# Patient Record
Sex: Female | Born: 1980 | Race: White | Hispanic: No | Marital: Married | Smoking: Never smoker
Health system: Southern US, Community
[De-identification: ages and names within clinical notes are randomized; demographics above are authoritative.]

## PROBLEM LIST (undated history)

## (undated) DIAGNOSIS — O24419 Gestational diabetes mellitus in pregnancy, unspecified control: Secondary | ICD-10-CM

## (undated) HISTORY — PX: DILATION AND CURETTAGE OF UTERUS: SHX78

## (undated) HISTORY — PX: WISDOM TOOTH EXTRACTION: SHX21

## (undated) HISTORY — DX: Gestational diabetes mellitus in pregnancy, unspecified control: O24.419

---

## 2005-10-31 ENCOUNTER — Other Ambulatory Visit: Admission: RE | Admit: 2005-10-31 | Discharge: 2005-10-31 | Payer: Self-pay | Admitting: Obstetrics and Gynecology

## 2005-11-08 ENCOUNTER — Ambulatory Visit (HOSPITAL_COMMUNITY): Admission: RE | Admit: 2005-11-08 | Discharge: 2005-11-08 | Payer: Self-pay | Admitting: Obstetrics and Gynecology

## 2005-11-29 ENCOUNTER — Ambulatory Visit (HOSPITAL_COMMUNITY): Admission: AD | Admit: 2005-11-29 | Discharge: 2005-11-30 | Payer: Self-pay | Admitting: Obstetrics and Gynecology

## 2005-11-29 ENCOUNTER — Encounter (INDEPENDENT_AMBULATORY_CARE_PROVIDER_SITE_OTHER): Payer: Self-pay | Admitting: Specialist

## 2006-08-21 ENCOUNTER — Other Ambulatory Visit: Admission: RE | Admit: 2006-08-21 | Discharge: 2006-08-21 | Payer: Self-pay | Admitting: Obstetrics and Gynecology

## 2006-10-28 ENCOUNTER — Inpatient Hospital Stay (HOSPITAL_COMMUNITY): Admission: AD | Admit: 2006-10-28 | Discharge: 2006-10-29 | Payer: Self-pay | Admitting: Obstetrics and Gynecology

## 2006-11-05 ENCOUNTER — Ambulatory Visit (HOSPITAL_COMMUNITY): Admission: RE | Admit: 2006-11-05 | Discharge: 2006-11-05 | Payer: Self-pay | Admitting: Obstetrics and Gynecology

## 2007-04-01 ENCOUNTER — Inpatient Hospital Stay (HOSPITAL_COMMUNITY): Admission: RE | Admit: 2007-04-01 | Discharge: 2007-04-03 | Payer: Self-pay | Admitting: Obstetrics and Gynecology

## 2007-04-25 ENCOUNTER — Other Ambulatory Visit: Admission: RE | Admit: 2007-04-25 | Discharge: 2007-04-25 | Payer: Self-pay | Admitting: Obstetrics and Gynecology

## 2007-12-12 IMAGING — US US OB TRANSVAGINAL
1 series · 14 of 25 positions shown · non-contrast
Comparison: 11/08/05.

CLINICAL DATA: 25-year-old female approximately 9 weeks pregnant with vaginal bleeding.  
TRANSVAGINAL OBSTETRICAL US:
TECHNIQUE: Transvaginal ultrasound was performed for evaluation of the gestation as well as the maternal uterus and adnexal regions.

[Series 1: us ob transvaginal · 0.19mm/px · 14 of 25 slices shown]
[im 1/25]
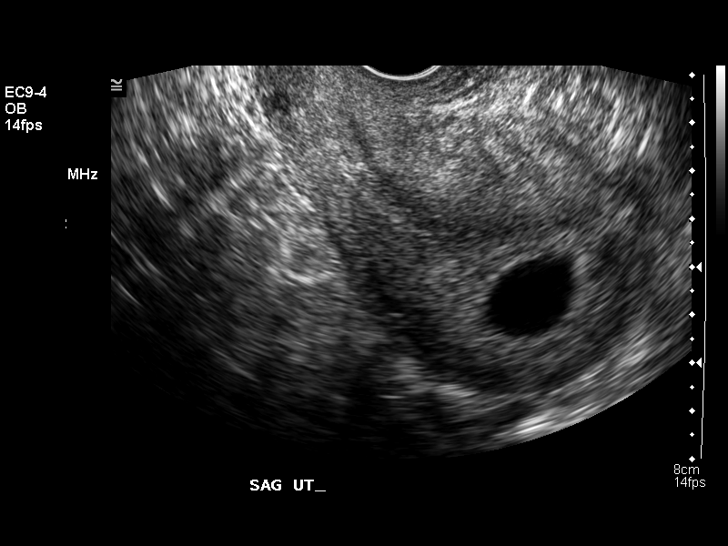
[im 3/25]
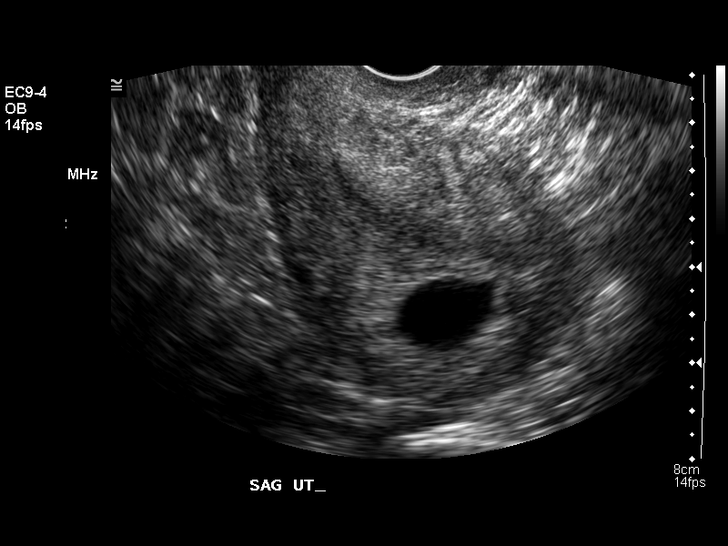
[im 5/25]
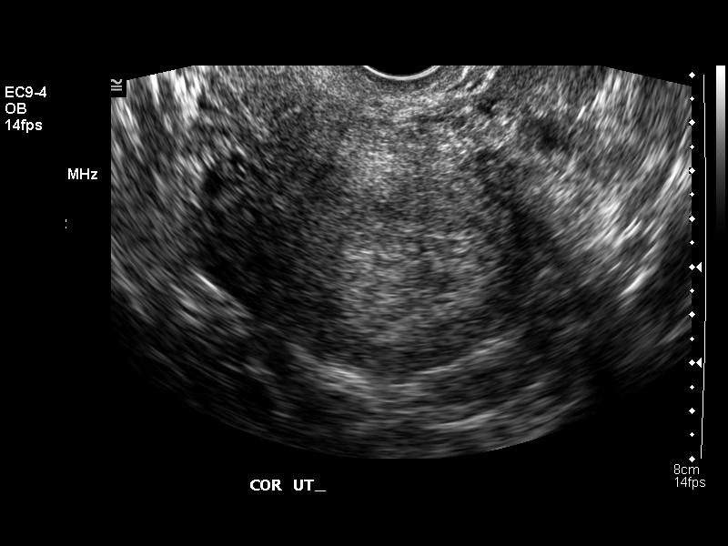
[im 7/25]
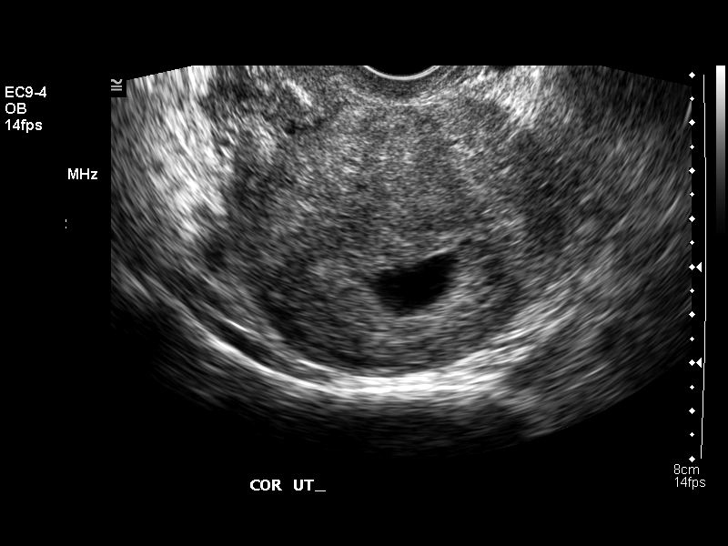
[im 9/25]
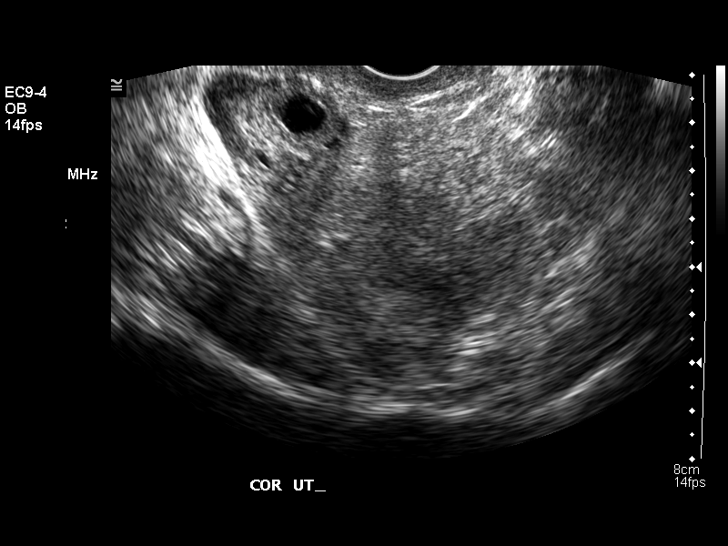
[im 10/25]
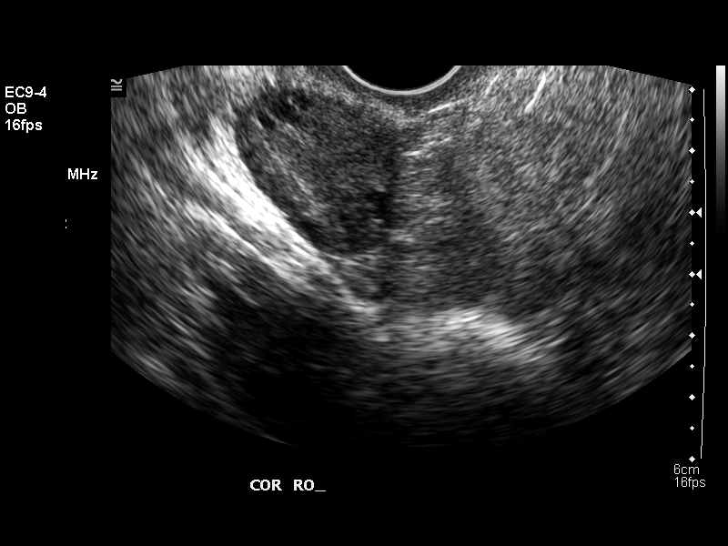
[im 12/25]
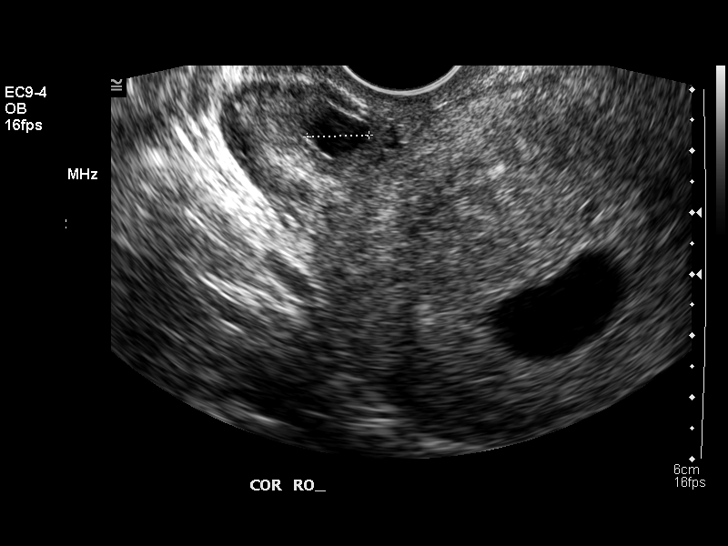
[im 14/25]
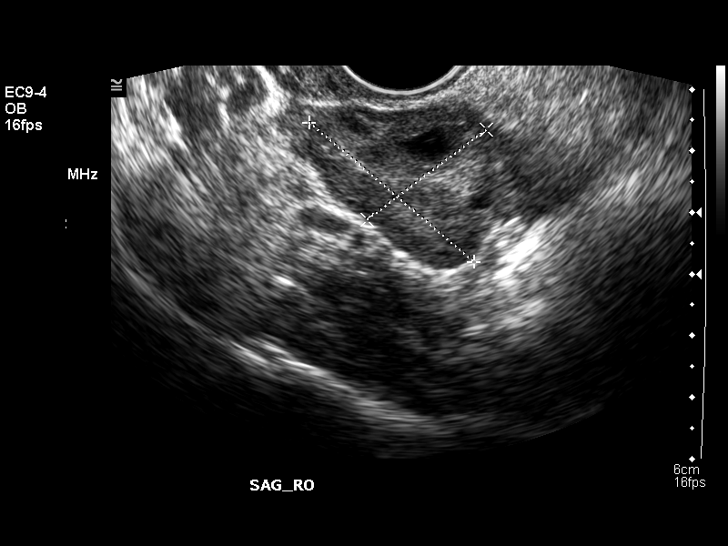
[im 16/25]
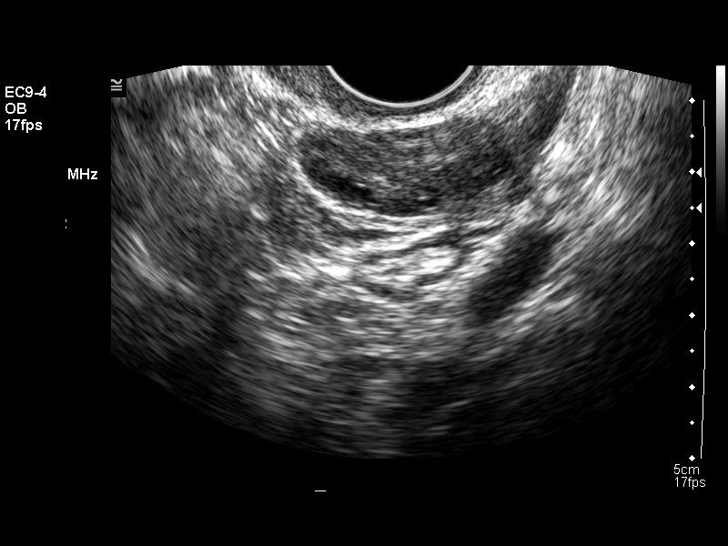
[im 17/25]
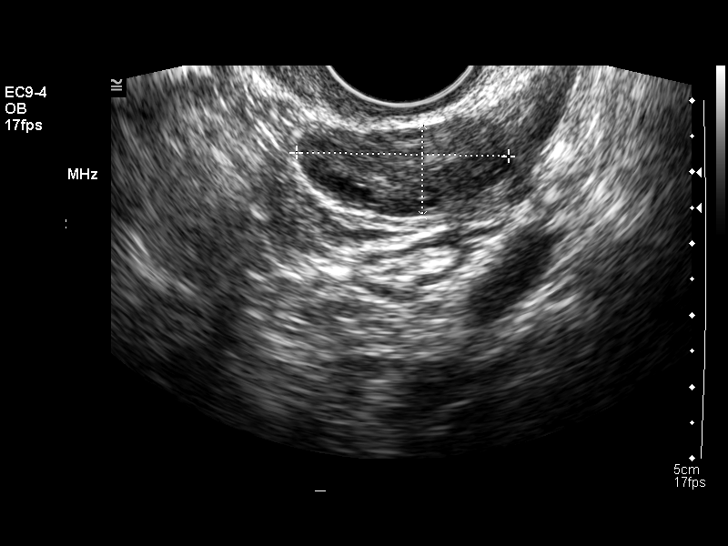
[im 19/25]
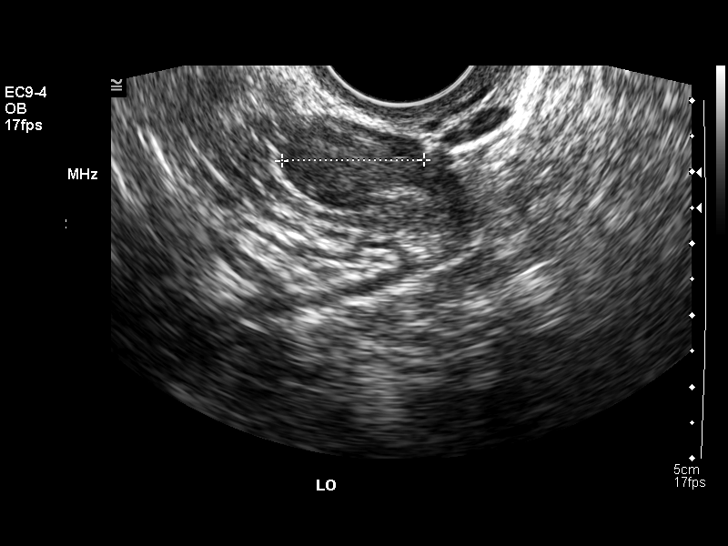
[im 21/25]
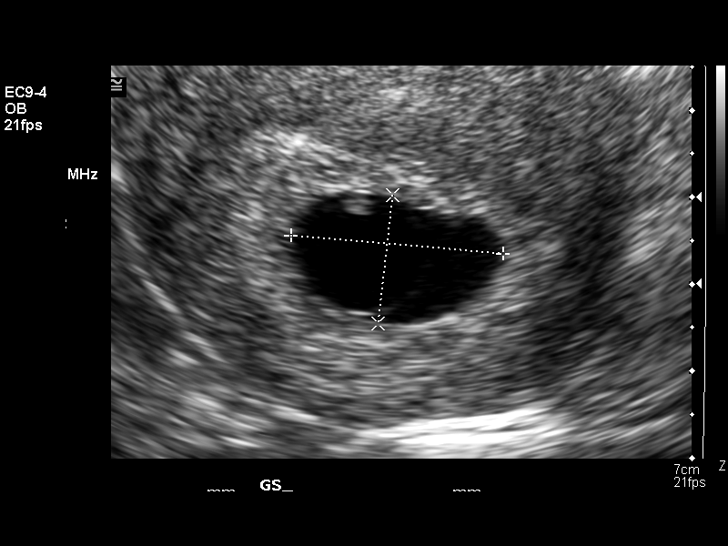
[im 23/25]
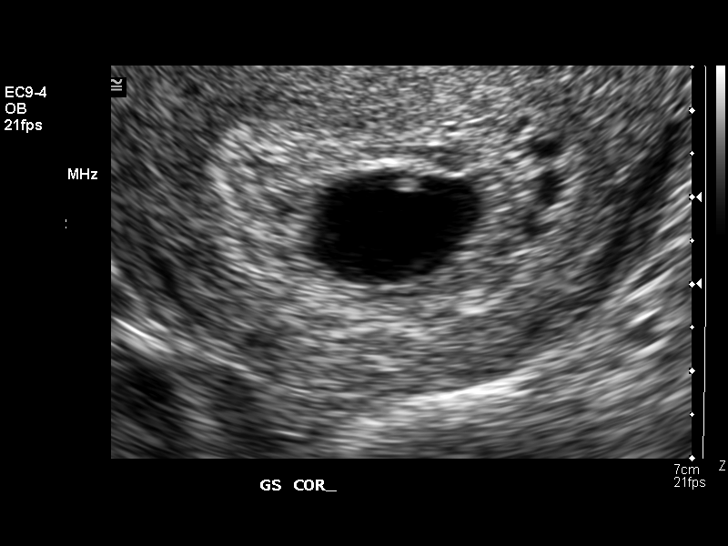
[im 25/25]
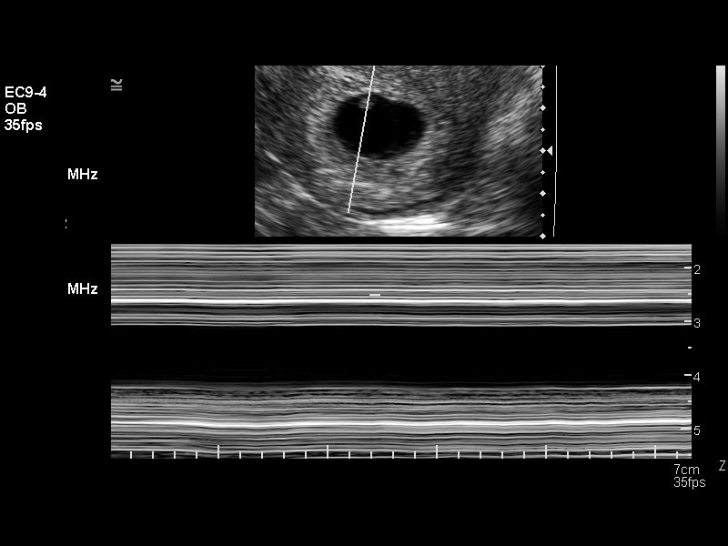

[14 of 25 positions shown; findings below may reference images not displayed]

FINDINGS: Within the uterus, there is a single intrauterine gestational sac with a mean sac diameter of 20 mm which correlates with a 6 week 6 day gestational age.  No yolk sac is visualized. A tiny fetal pole is demonstrated with a crown rump length of only 3.3 mm.  No detectable heart rate or visualized cardiac activity.  Previously the fetal pole had a low heart rate at 88 bpm on the 11/08/05 exam.  The appearance today is consistent with fetal demise.
No subchorionic hemorrhage.  The right ovary measures 3.5 x 2.4 x 2.5 cm and contains a 14 mm corpus luteum cyst.  The left ovary is normal measuring 3.0 x 1.3 x 2.0 cm.  No free fluid.
IMPRESSION: No detectable fetal cardiac activity with a small fetal pole and lack of progression when compared to 11/08/05.  The ultrasound findings are consistent with fetal demise.

## 2008-06-25 ENCOUNTER — Ambulatory Visit (HOSPITAL_COMMUNITY): Admission: RE | Admit: 2008-06-25 | Discharge: 2008-06-25 | Payer: Self-pay | Admitting: Obstetrics

## 2008-11-02 ENCOUNTER — Inpatient Hospital Stay (HOSPITAL_COMMUNITY): Admission: AD | Admit: 2008-11-02 | Discharge: 2008-11-02 | Payer: Self-pay | Admitting: Obstetrics

## 2008-11-17 IMAGING — US US OB FOLLOW-UP
1 series · 2 of 2 positions shown · non-contrast
Comparison: none

OBSTETRICAL ULTRASOUND:

 This ultrasound exam was performed in the [HOSPITAL] Ultrasound Department.  The OB US report was generated in the AS system, and faxed to the ordering physician.  This report is also available in [REDACTED] PACS.

[Series 1: us ob follow-up · 0.22mm/px · 2 of 2 slices shown]
[im 1/2]
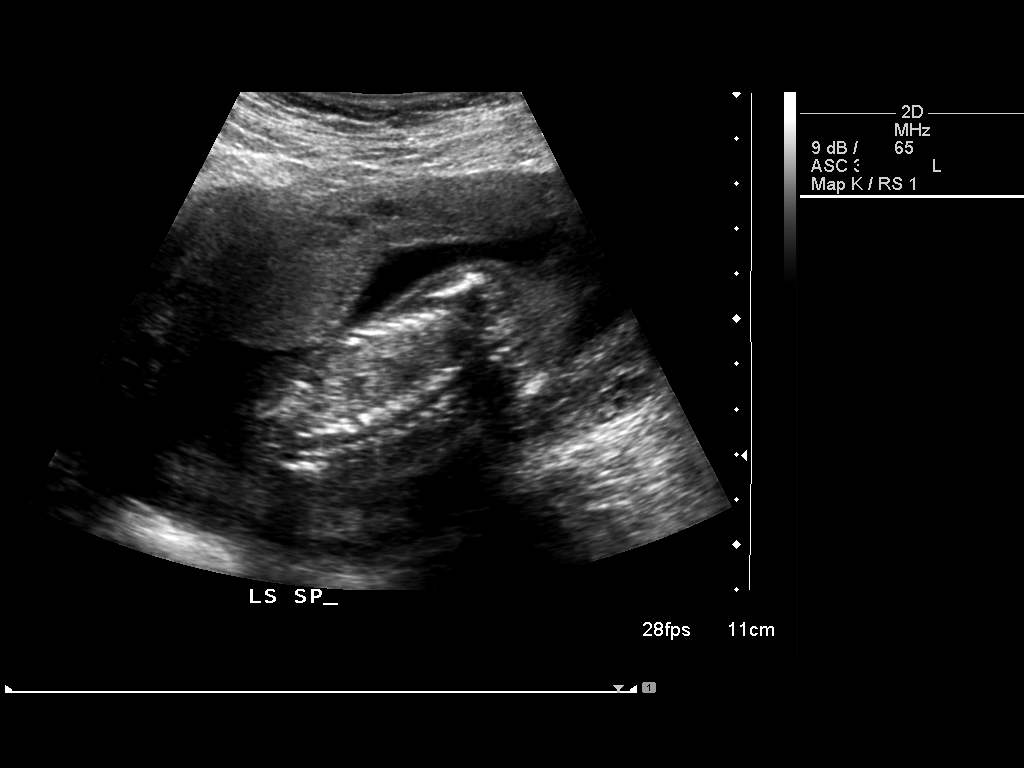
[im 2/2]
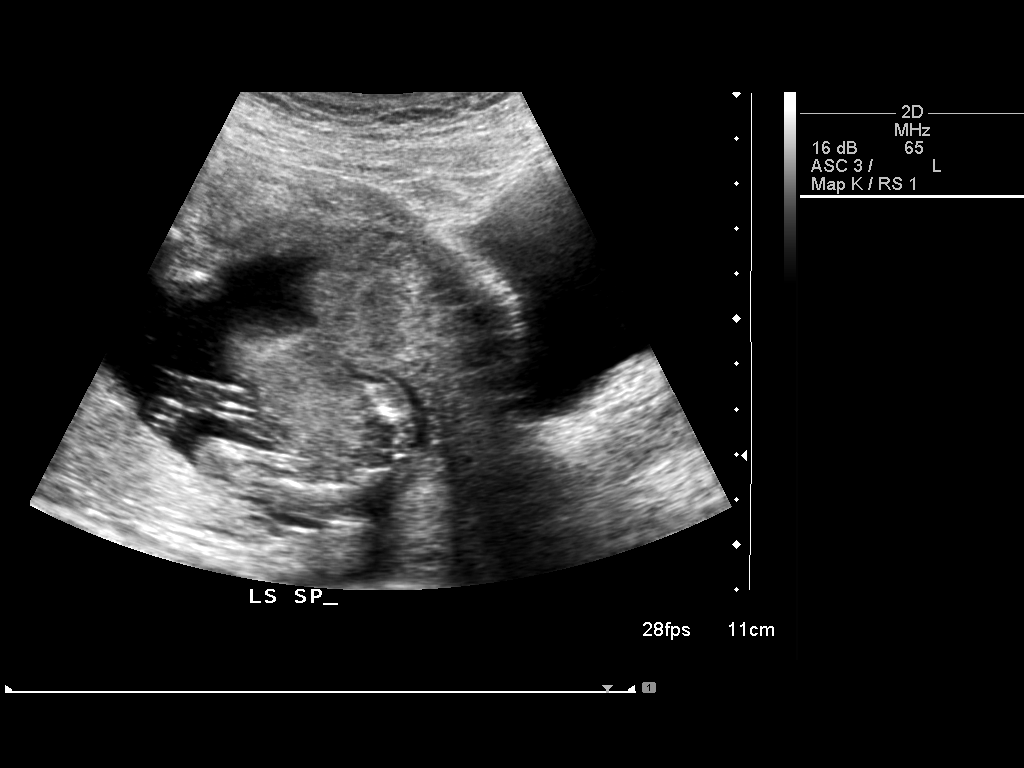

[2 of 2 positions shown; findings below may reference images not displayed]

IMPRESSION: See AS Obstetric US report.

## 2009-01-25 ENCOUNTER — Ambulatory Visit (HOSPITAL_COMMUNITY): Admission: RE | Admit: 2009-01-25 | Discharge: 2009-01-25 | Payer: Self-pay | Admitting: Obstetrics

## 2009-01-28 ENCOUNTER — Ambulatory Visit (HOSPITAL_COMMUNITY): Admission: RE | Admit: 2009-01-28 | Discharge: 2009-01-28 | Payer: Self-pay | Admitting: Obstetrics

## 2009-01-31 ENCOUNTER — Ambulatory Visit (HOSPITAL_COMMUNITY): Admission: RE | Admit: 2009-01-31 | Discharge: 2009-01-31 | Payer: Self-pay | Admitting: Obstetrics

## 2009-02-03 ENCOUNTER — Ambulatory Visit (HOSPITAL_COMMUNITY): Admission: RE | Admit: 2009-02-03 | Discharge: 2009-02-03 | Payer: Self-pay | Admitting: Obstetrics

## 2009-02-07 ENCOUNTER — Inpatient Hospital Stay (HOSPITAL_COMMUNITY): Admission: AD | Admit: 2009-02-07 | Discharge: 2009-02-09 | Payer: Self-pay | Admitting: Obstetrics

## 2010-03-22 ENCOUNTER — Ambulatory Visit (HOSPITAL_COMMUNITY): Admission: RE | Admit: 2010-03-22 | Discharge: 2010-03-22 | Payer: Self-pay | Admitting: Obstetrics

## 2010-06-05 ENCOUNTER — Encounter: Payer: Self-pay | Admitting: Obstetrics

## 2010-07-10 ENCOUNTER — Inpatient Hospital Stay (HOSPITAL_COMMUNITY)
Admission: AD | Admit: 2010-07-10 | Discharge: 2010-07-11 | Disposition: A | Discharge status: Home / Self Care | Payer: BC Managed Care – PPO | Source: Ambulatory Visit | Attending: Obstetrics | Admitting: Obstetrics

## 2010-07-10 DIAGNOSIS — O4700 False labor before 37 completed weeks of gestation, unspecified trimester: Secondary | ICD-10-CM | POA: Insufficient documentation

## 2010-07-11 ENCOUNTER — Inpatient Hospital Stay (HOSPITAL_COMMUNITY)
Admission: AD | Admit: 2010-07-11 | Discharge: 2010-07-11 | Disposition: A | Discharge status: Home / Self Care | Payer: BC Managed Care – PPO | Source: Ambulatory Visit | Attending: Obstetrics | Admitting: Obstetrics

## 2010-07-11 DIAGNOSIS — O4700 False labor before 37 completed weeks of gestation, unspecified trimester: Secondary | ICD-10-CM

## 2010-07-11 LAB — URINALYSIS, ROUTINE W REFLEX MICROSCOPIC
Hgb urine dipstick: NEGATIVE
Ketones, ur: 15 mg/dL — AB
Urine Glucose, Fasting: NEGATIVE mg/dL
pH: 7.5 (ref 5.0–8.0)

## 2010-07-28 ENCOUNTER — Observation Stay (HOSPITAL_COMMUNITY)
Admission: AD | Admit: 2010-07-28 | Discharge: 2010-07-29 | DRG: 382 | Disposition: A | Discharge status: Home / Self Care | Payer: BC Managed Care – PPO | Source: Ambulatory Visit | Attending: Obstetrics & Gynecology | Admitting: Obstetrics & Gynecology

## 2010-07-28 DIAGNOSIS — Z22330 Carrier of Group B streptococcus: Secondary | ICD-10-CM

## 2010-07-28 DIAGNOSIS — O479 False labor, unspecified: Principal | ICD-10-CM | POA: Diagnosis present

## 2010-07-28 DIAGNOSIS — O99891 Other specified diseases and conditions complicating pregnancy: Secondary | ICD-10-CM | POA: Diagnosis present

## 2010-07-29 LAB — CBC
HCT: 33.1 % — ABNORMAL LOW (ref 36.0–46.0)
Hemoglobin: 10.7 g/dL — ABNORMAL LOW (ref 12.0–15.0)
WBC: 10 10*3/uL (ref 4.0–10.5)

## 2010-08-06 ENCOUNTER — Inpatient Hospital Stay (HOSPITAL_COMMUNITY)
Admission: AD | Admit: 2010-08-06 | Discharge: 2010-08-09 | DRG: 373 | Disposition: A | Discharge status: Home / Self Care | Payer: BC Managed Care – PPO | Source: Ambulatory Visit | Attending: Obstetrics | Admitting: Obstetrics

## 2010-08-06 DIAGNOSIS — O99892 Other specified diseases and conditions complicating childbirth: Principal | ICD-10-CM | POA: Diagnosis present

## 2010-08-06 DIAGNOSIS — Z22330 Carrier of Group B streptococcus: Secondary | ICD-10-CM

## 2010-08-06 LAB — CBC
HCT: 31.6 % — ABNORMAL LOW (ref 36.0–46.0)
MCV: 83.8 fL (ref 78.0–100.0)
RBC: 3.77 MIL/uL — ABNORMAL LOW (ref 3.87–5.11)
WBC: 5.6 10*3/uL (ref 4.0–10.5)

## 2010-08-08 LAB — CBC
HCT: 27.8 % — ABNORMAL LOW (ref 36.0–46.0)
Hemoglobin: 8.6 g/dL — ABNORMAL LOW (ref 12.0–15.0)
MCH: 26.4 pg (ref 26.0–34.0)
MCV: 85.3 fL (ref 78.0–100.0)
RBC: 3.26 MIL/uL — ABNORMAL LOW (ref 3.87–5.11)

## 2010-08-18 LAB — CBC
HCT: 27.3 % — ABNORMAL LOW (ref 36.0–46.0)
HCT: 32.6 % — ABNORMAL LOW (ref 36.0–46.0)
Hemoglobin: 10.9 g/dL — ABNORMAL LOW (ref 12.0–15.0)
Hemoglobin: 9.3 g/dL — ABNORMAL LOW (ref 12.0–15.0)
MCHC: 33.5 g/dL (ref 30.0–36.0)
MCHC: 34 g/dL (ref 30.0–36.0)
MCV: 83.2 fL (ref 78.0–100.0)
MCV: 83.4 fL (ref 78.0–100.0)
Platelets: 208 K/uL (ref 150–400)
Platelets: 267 K/uL (ref 150–400)
RBC: 3.27 MIL/uL — ABNORMAL LOW (ref 3.87–5.11)
RBC: 3.92 MIL/uL (ref 3.87–5.11)
RDW: 16.3 % — ABNORMAL HIGH (ref 11.5–15.5)
RDW: 16.7 % — ABNORMAL HIGH (ref 11.5–15.5)
WBC: 9.5 K/uL (ref 4.0–10.5)
WBC: 9.8 K/uL (ref 4.0–10.5)

## 2010-08-18 LAB — SYPHILIS: RPR W/REFLEX TO RPR TITER AND TREPONEMAL ANTIBODIES, TRADITIONAL SCREENING AND DIAGNOSIS ALGORITHM: RPR Ser Ql: NONREACTIVE

## 2010-08-21 LAB — URINALYSIS, ROUTINE W REFLEX MICROSCOPIC
Hgb urine dipstick: NEGATIVE
Protein, ur: NEGATIVE mg/dL
Urobilinogen, UA: 0.2 mg/dL (ref 0.0–1.0)

## 2010-09-26 NOTE — Discharge Summary (Signed)
NAME:  Whitney Barajas, Whitney Barajas           ACCOUNT NO.:  [id number]   MEDICAL RECORD NO.:  [id number]          PATIENT TYPE:  INP   LOCATION:  9121                          FACILITY:  [facility]   PHYSICIAN:  James A. Ashley Royalty, M.D.DATE OF BIRTH:  12-21-1980   DATE OF ADMISSION:  04/01/2007  DATE OF DISCHARGE:  04/03/2007                               DISCHARGE SUMMARY   DISCHARGE DIAGNOSES:  1. Intrauterine pregnancy at [redacted] weeks gestation, delivered.  2. Term birth living child, vertex.  3. Group B strep carrier.   OPERATIONS AND PROCEDURES:  OB delivery.   COMPLICATIONS:  None.   DISCHARGE MEDICATIONS:  1. Darvocet.  2. Motrin 600 mg.   HISTORY AND PHYSICAL:  This is a 25-year gravida 4, para 1-0-2-1 at 39+  weeks' gestation.  Prenatal care was complicated by smoking and group B  strep carrier.  The patient was admitted for induction secondary to  advanced cervical changes and musculoskeletal discomfort.  For the  remainder of the history and physical, please see chart.   HOSPITAL COURSE:  The patient was admitted to [facility]  [place].  Initial laboratory studies were drawn.  She was given  antibiotic prophylaxis.  Initial cervical examination was 3 cm, 80%, -2  to -3 station, vertex presentation.  Artificial rupture of membranes was  accomplished and intrauterine pressure catheter placed.  The patient was  given Pitocin.  She went on to deliver on April 01, 2007 a 7 pounds  11 ounces female, Apgars 9 at 1 minute and 9 at 5 minutes, sent to newborn  nursery.  Delivery was accomplished by Dr. Sylvester Harder over an intact  perineum.  There were no lacerations.  The patient's postpartum course  was benign.  She was discharged on the second postpartum day afebrile  and in satisfactory condition.   DISPOSITION:  The patient is to return to [facility] and  Obstetrics on April 25, 2007 for a postpartum evaluation.      James A. Ashley Royalty, M.D.  Electronically  Signed     JAM/MEDQ  D:  04/23/2007  T:  04/24/2007  Job:  [id number]

## 2010-09-29 NOTE — Op Note (Signed)
NAME:  Whitney Barajas, Whitney Barajas           ACCOUNT NO.:  [id number]   MEDICAL RECORD NO.:  [id number]          PATIENT TYPE:  AMB   LOCATION:  SDC                           FACILITY:  [facility]   PHYSICIAN:  James A. Ashley Royalty, M.D.DATE OF BIRTH:  05/17/1980   DATE OF PROCEDURE:  11/29/2005  DATE OF DISCHARGE:  11/30/2005                                 OPERATIVE REPORT   PREOPERATIVE DIAGNOSES:  1.  Intrauterine pregnancy at approximately [redacted] weeks gestation.  2.  Inevitable abortion.   POSTOPERATIVE DIAGNOSES:  1.  Intrauterine pregnancy at approximately [redacted] weeks gestation.  2.  Inevitable abortion.   PROCEDURE:  Suction dilatation curettage.   SURGEON:  Rudy Jew. Ashley Royalty, M.D.   ANESTHESIA:  Monitored anesthesia care with 1% Xylocaine paracervical block  (20 mL).   ESTIMATED BLOOD LOSS:  Less than 25 mL.   COMPLICATIONS:  None.   PACKS AND DRAINS:  None.   SPECIMENS:  Products of conception.   PROCEDURE:  The patient taken to the operating room, placed in the dorsal  supine position.  After IV sedation was administered, she was placed in the  lithotomy position and prepped and draped in usual manner for vaginal  surgery.  Posterior weighted retractor was placed per vagina.  The anterior  lip of cervix grasped with a single-tooth tenaculum.  Approximately 20 mL of  1% Xylocaine were instilled circumferentially in the cervix to create a  paracervical block.  The uterus was then gently sounded to approximately 10  cm.  The cervix was noted to be already substantially dilated.  It was  verified as dilated to size 29 Pratt without any effort.  It was further  dilated to size 31 Pratt.  10 mm suction curette was then introduced in the  uterine cavity and suction applied.  A moderate amount of apparent products  of conception were delivered through the tubing.  After several passes with  the suction  curette, no additional tissue was obtained.  At this point the patient was  felt to have  benefited maximally from the surgical procedure.  The vaginal  instruments removed.  Hemostasis noted and the procedure terminated.   The patient was taken to the recovery room in excellent condition.  Blood  type is O positive.      James A. Ashley Royalty, M.D.  Electronically Signed     JAM/MEDQ  D:  11/29/2005  T:  11/30/2005  Job:  [id number]

## 2011-02-20 LAB — CBC
HCT: 33.5 — ABNORMAL LOW
MCHC: 34.9
MCV: 90.9
MCV: 91.8
Platelets: 232
Platelets: 294
RDW: 13.9
WBC: 11.8 — ABNORMAL HIGH

## 2011-02-20 LAB — RPR: RPR Ser Ql: NONREACTIVE

## 2011-02-20 LAB — TYPE AND SCREEN
ABO/RH(D): O POS
Antibody Screen: NEGATIVE

## 2011-02-28 LAB — CBC
HCT: 37.5
Platelets: 289
RDW: 13.3
WBC: 9.6

## 2011-02-28 LAB — KLEIHAUER-BETKE STAIN: Fetal Cells %: 0

## 2011-02-28 LAB — ABO/RH: ABO/RH(D): O POS

## 2012-04-03 IMAGING — US US OB DETAIL+14 WK
1 series · 14 of 28 positions shown · non-contrast
Comparison: none

OBSTETRICAL ULTRASOUND:
 This ultrasound exam was performed in the [HOSPITAL] Ultrasound Department.  The OB US report was generated in the AS system, and faxed to the ordering physician.  This report is also available in [HOSPITAL]?s [employer] and in [REDACTED] PACS.

[Series 1: us ob detail +14 wk · 0.22mm/px · 14 of 71 slices shown]
[im 3/71]
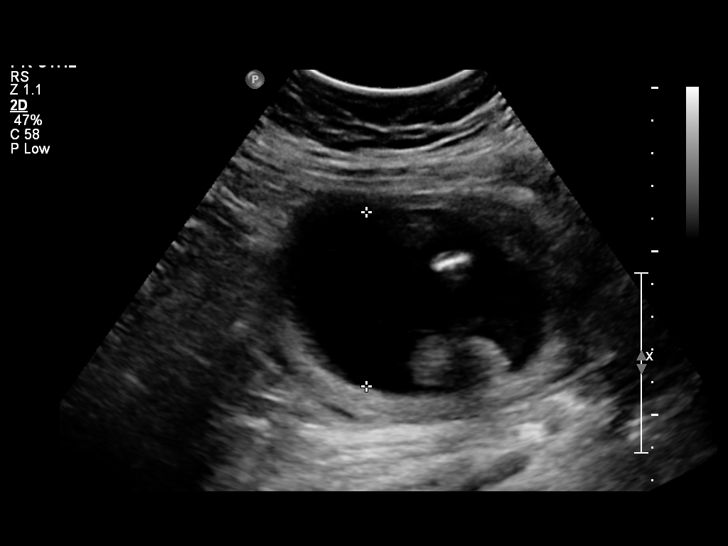
[im 8/71]
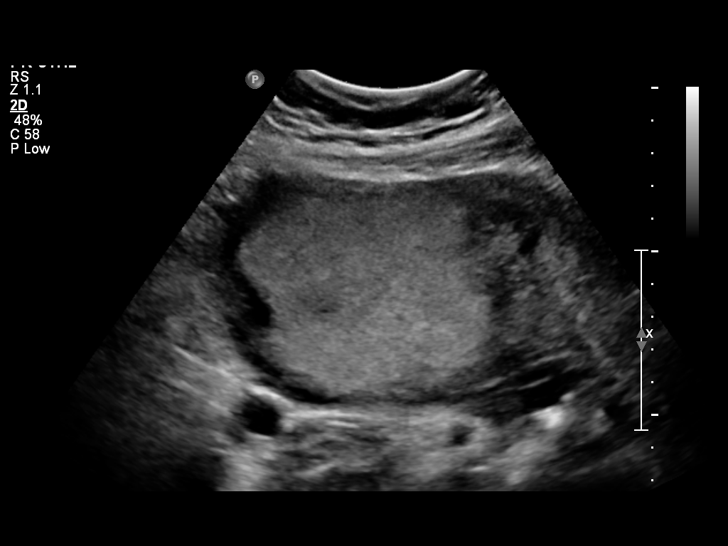
[im 13/71]
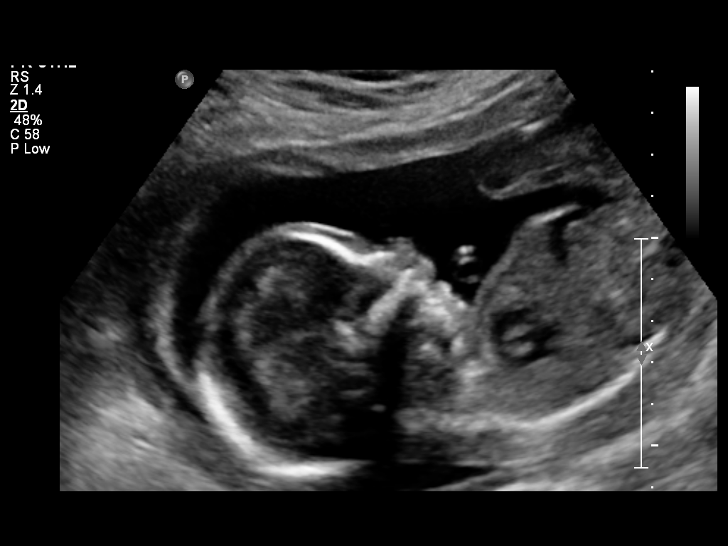
[im 19/71]
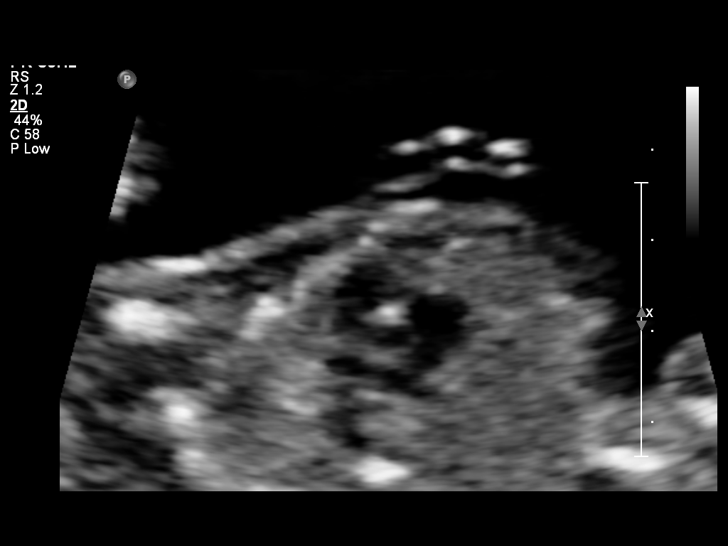
[im 24/71]
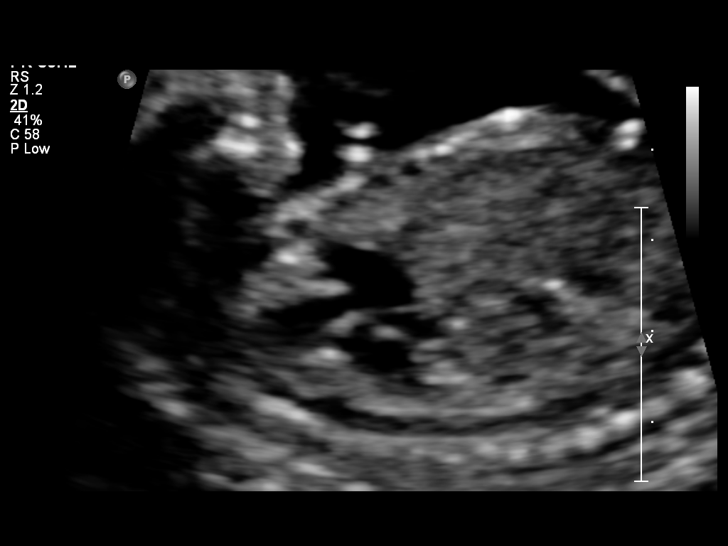
[im 29/71]
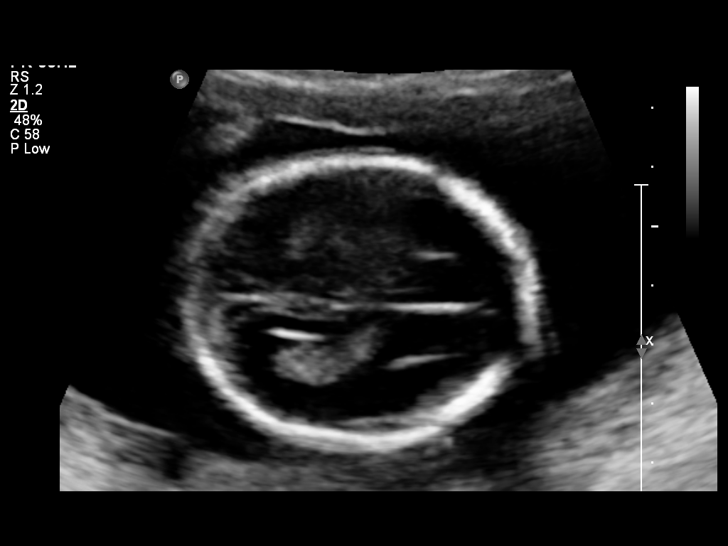
[im 34/71]
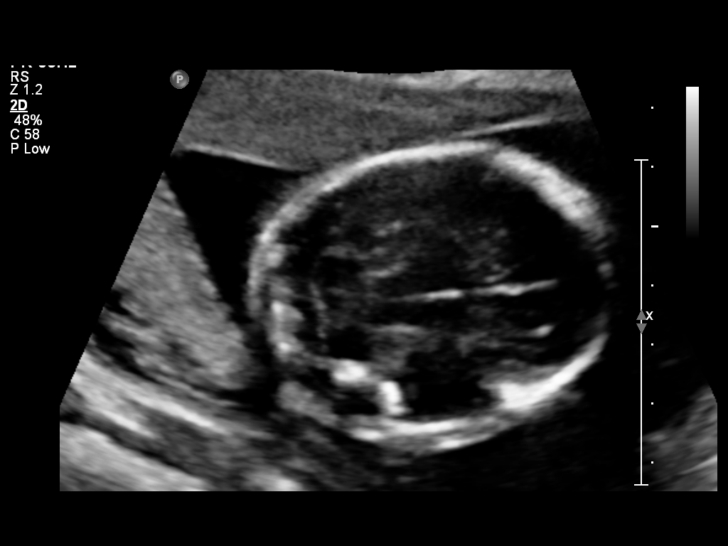
[im 39/71]
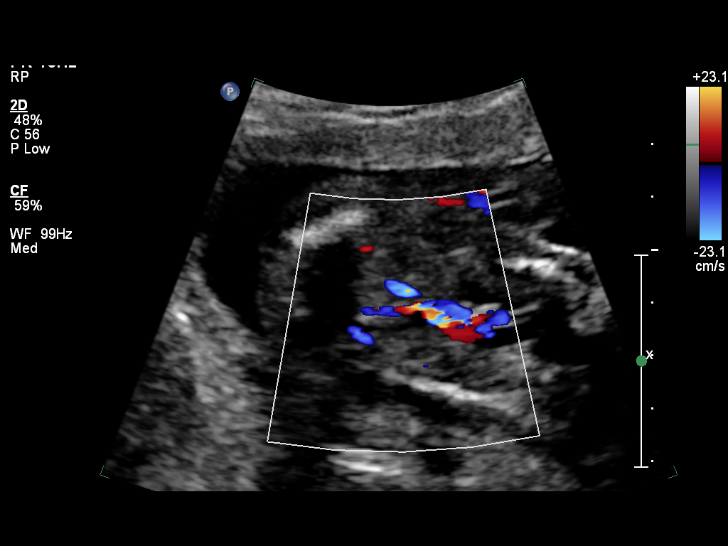
[im 45/71]
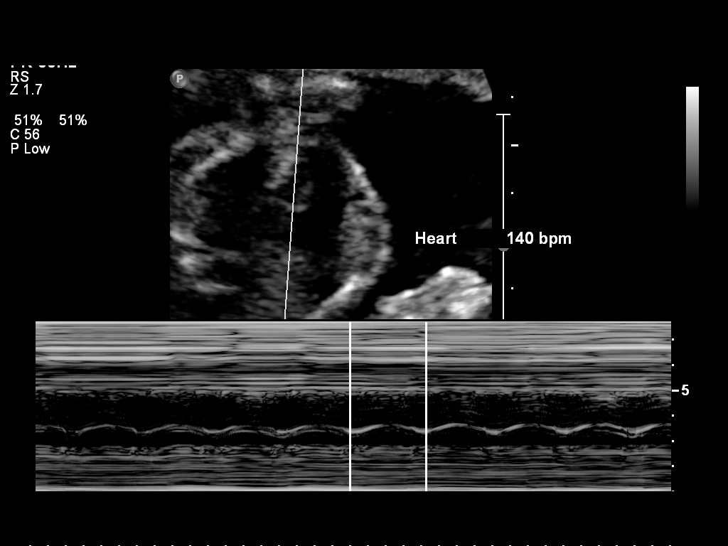
[im 50/71]
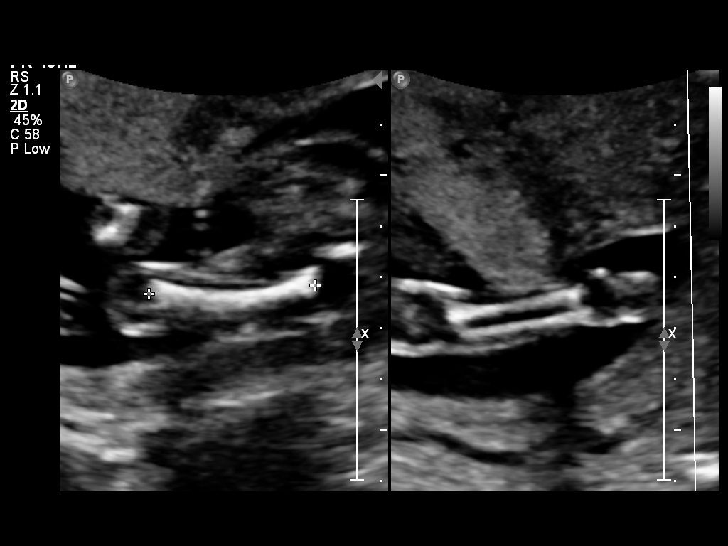
[im 55/71]
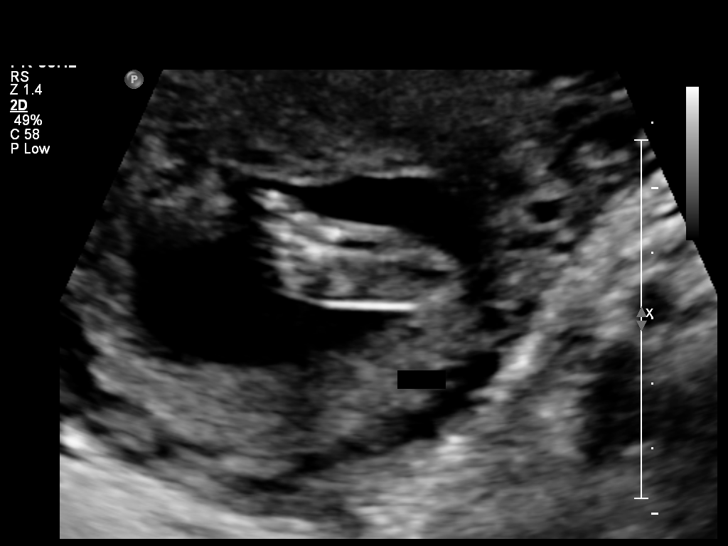
[im 60/71]
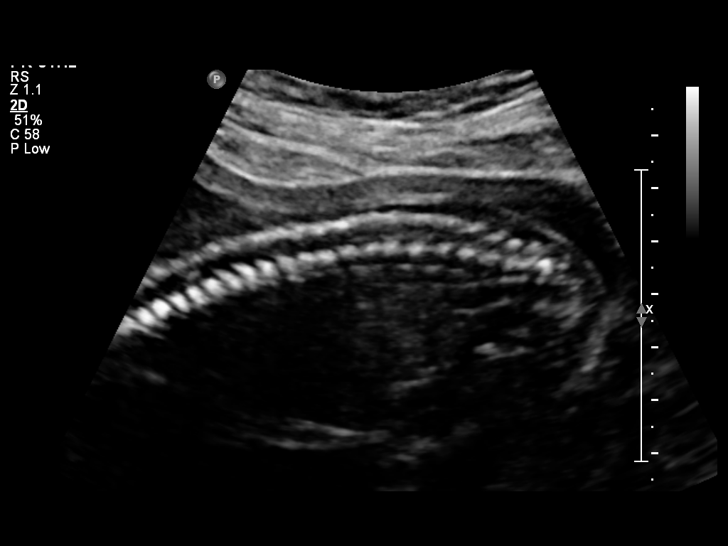
[im 65/71]
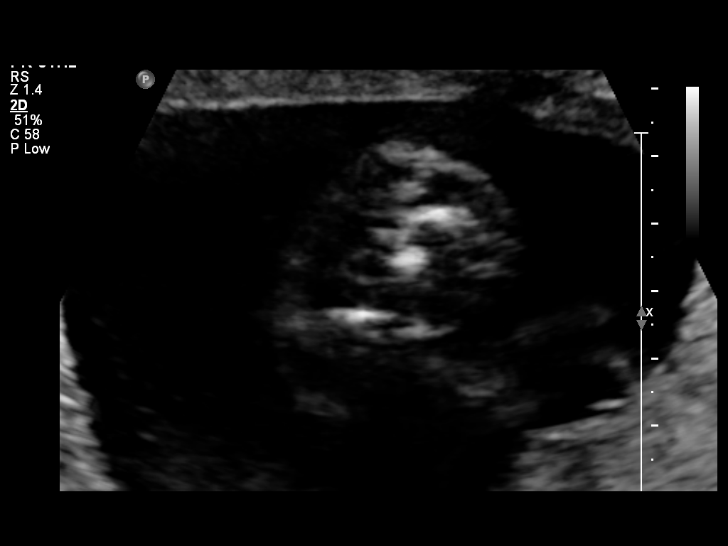
[im 71/71]
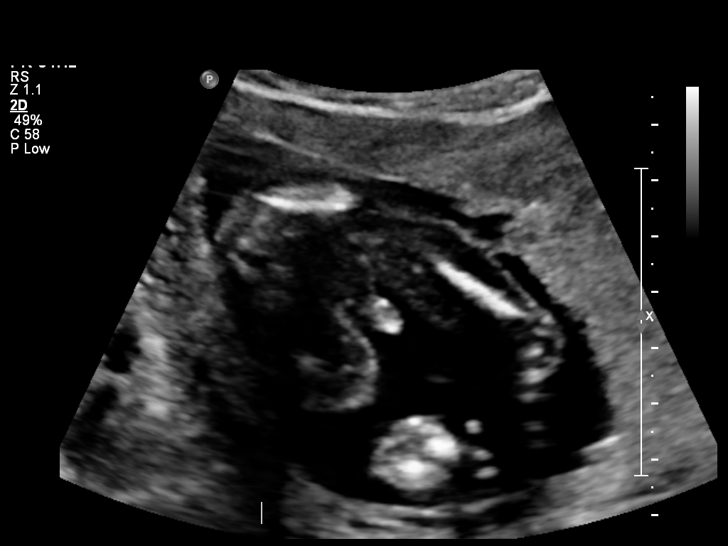

[14 of 28 positions shown; findings below may reference images not displayed]

IMPRESSION: See AS Obstetric US report.

## 2014-04-16 LAB — OB RESULTS CONSOLE ABO/RH: RH Type: POSITIVE

## 2014-04-16 LAB — OB RESULTS CONSOLE GC/CHLAMYDIA
Chlamydia: NEGATIVE
GC PROBE AMP, GENITAL: NEGATIVE

## 2014-04-16 LAB — OB RESULTS CONSOLE HIV ANTIBODY (ROUTINE TESTING): HIV: NONREACTIVE

## 2014-04-16 LAB — OB RESULTS CONSOLE HEPATITIS B SURFACE ANTIGEN: Hepatitis B Surface Ag: NEGATIVE

## 2014-04-16 LAB — OB RESULTS CONSOLE RPR: RPR: NONREACTIVE

## 2014-04-16 LAB — OB RESULTS CONSOLE RUBELLA ANTIBODY, IGM: RUBELLA: IMMUNE

## 2014-05-14 NOTE — L&D Delivery Note (Signed)
Delivery Note At 5:39 AM a viable female "Whitney Barajas" was delivered via Vaginal, Spontaneous Delivery (Presentation: OA restituting to Right Occiput Anterior).  APGARS: 8, 9; weight pending.   Placenta status: Intact, Spontaneous, sent to path due to dx of preeclampsia w/ severe features.  Cord: 3 vessels with the following complications: None.  Cord pH: NA   Anesthesia: Epidural  Episiotomy: None Lacerations: Abrasions Suture Repair: NA Est. Blood Loss (mL): 574  Mom to AICU due to Magnesium Sulfate x 24 hrs. Preeclampsia labs in am, along w/ Magnesium level.  Baby to Couplet care / Skin to Skin.  Mom plans to breastfeed.  Elects Micronor for birth control.  Couple desires inpatient circumcision - R/B of circumcision reviewed w/ pt and spouse. Consent obtained, witnessed and placed in Circumcision binder.  Whitney Barajas, Whitney Barajas 09/04/2014, 6:33 AM

## 2014-05-19 ENCOUNTER — Ambulatory Visit: Payer: BC Managed Care – PPO

## 2014-06-02 ENCOUNTER — Encounter: Payer: No Typology Code available for payment source | Attending: Family Medicine

## 2014-06-02 VITALS — Ht 66.0 in | Wt 203.2 lb

## 2014-06-02 DIAGNOSIS — R7309 Other abnormal glucose: Secondary | ICD-10-CM

## 2014-06-02 DIAGNOSIS — Z713 Dietary counseling and surveillance: Secondary | ICD-10-CM | POA: Diagnosis not present

## 2014-06-02 DIAGNOSIS — R7302 Impaired glucose tolerance (oral): Secondary | ICD-10-CM | POA: Diagnosis not present

## 2014-06-02 NOTE — Progress Notes (Signed)
  Patient was seen on 06/02/2014 for Gestational Diabetes self-management class at the [facility]. The following learning objectives were met by the patient during this course:   States the definition of Gestational Diabetes  States why dietary management is important in controlling blood glucose  Describes the effects each nutrient has on blood glucose levels  Demonstrates ability to create a balanced meal plan  Demonstrates carbohydrate counting   States when to check blood glucose levels  Demonstrates proper blood glucose monitoring techniques  States the effect of stress and exercise on blood glucose levels  States the importance of limiting caffeine and abstaining from alcohol and smoking  Blood glucose monitor given: Accu Chek Nano BG Monitoring Kit  Lot # [id number] Exp: 04/13/2015 Blood glucose reading: 109 mg/dl  Patient instructed to monitor glucose levels: FBS: 60 - <90 1 hour: <140 2 hour: <120  *Patient received handouts:  Nutrition Diabetes and Pregnancy  Carbohydrate Counting List  Patient will be seen for follow-up as needed.

## 2014-09-02 ENCOUNTER — Inpatient Hospital Stay (HOSPITAL_COMMUNITY)
Admission: AD | Admit: 2014-09-02 | Discharge: 2014-09-06 | DRG: 774 | Disposition: A | Discharge status: Home / Self Care | Payer: No Typology Code available for payment source | Source: Ambulatory Visit | Attending: Obstetrics and Gynecology | Admitting: Obstetrics and Gynecology

## 2014-09-02 ENCOUNTER — Encounter (HOSPITAL_COMMUNITY): Payer: Self-pay | Admitting: *Deleted

## 2014-09-02 DIAGNOSIS — O0933 Supervision of pregnancy with insufficient antenatal care, third trimester: Secondary | ICD-10-CM | POA: Diagnosis not present

## 2014-09-02 DIAGNOSIS — Z6835 Body mass index (BMI) 35.0-35.9, adult: Secondary | ICD-10-CM | POA: Diagnosis not present

## 2014-09-02 DIAGNOSIS — O0930 Supervision of pregnancy with insufficient antenatal care, unspecified trimester: Secondary | ICD-10-CM

## 2014-09-02 DIAGNOSIS — K219 Gastro-esophageal reflux disease without esophagitis: Secondary | ICD-10-CM | POA: Diagnosis present

## 2014-09-02 DIAGNOSIS — O322XX0 Maternal care for transverse and oblique lie, not applicable or unspecified: Secondary | ICD-10-CM | POA: Diagnosis present

## 2014-09-02 DIAGNOSIS — Z8632 Personal history of gestational diabetes: Secondary | ICD-10-CM | POA: Diagnosis not present

## 2014-09-02 DIAGNOSIS — O99214 Obesity complicating childbirth: Secondary | ICD-10-CM | POA: Diagnosis present

## 2014-09-02 DIAGNOSIS — O1413 Severe pre-eclampsia, third trimester: Principal | ICD-10-CM | POA: Diagnosis present

## 2014-09-02 DIAGNOSIS — R739 Hyperglycemia, unspecified: Secondary | ICD-10-CM | POA: Diagnosis not present

## 2014-09-02 DIAGNOSIS — Z880 Allergy status to penicillin: Secondary | ICD-10-CM

## 2014-09-02 DIAGNOSIS — Z6837 Body mass index (BMI) 37.0-37.9, adult: Secondary | ICD-10-CM

## 2014-09-02 DIAGNOSIS — O9962 Diseases of the digestive system complicating childbirth: Secondary | ICD-10-CM | POA: Diagnosis present

## 2014-09-02 DIAGNOSIS — Z789 Other specified health status: Secondary | ICD-10-CM

## 2014-09-02 DIAGNOSIS — O9902 Anemia complicating childbirth: Secondary | ICD-10-CM | POA: Diagnosis not present

## 2014-09-02 DIAGNOSIS — O9989 Other specified diseases and conditions complicating pregnancy, childbirth and the puerperium: Secondary | ICD-10-CM

## 2014-09-02 DIAGNOSIS — Z3A36 36 weeks gestation of pregnancy: Secondary | ICD-10-CM | POA: Diagnosis present

## 2014-09-02 DIAGNOSIS — E669 Obesity, unspecified: Secondary | ICD-10-CM | POA: Diagnosis present

## 2014-09-02 DIAGNOSIS — O1403 Mild to moderate pre-eclampsia, third trimester: Secondary | ICD-10-CM | POA: Diagnosis present

## 2014-09-02 DIAGNOSIS — D649 Anemia, unspecified: Secondary | ICD-10-CM | POA: Diagnosis not present

## 2014-09-02 DIAGNOSIS — Z91018 Allergy to other foods: Secondary | ICD-10-CM | POA: Diagnosis not present

## 2014-09-02 DIAGNOSIS — Z885 Allergy status to narcotic agent status: Secondary | ICD-10-CM | POA: Diagnosis not present

## 2014-09-02 DIAGNOSIS — Z8659 Personal history of other mental and behavioral disorders: Secondary | ICD-10-CM

## 2014-09-02 DIAGNOSIS — Z641 Problems related to multiparity: Secondary | ICD-10-CM

## 2014-09-02 DIAGNOSIS — O1493 Unspecified pre-eclampsia, third trimester: Secondary | ICD-10-CM | POA: Diagnosis present

## 2014-09-02 LAB — CBC
HEMATOCRIT: 27 % — AB (ref 36.0–46.0)
Hemoglobin: 8.5 g/dL — ABNORMAL LOW (ref 12.0–15.0)
MCH: 25.1 pg — ABNORMAL LOW (ref 26.0–34.0)
MCHC: 31.5 g/dL (ref 30.0–36.0)
MCV: 79.6 fL (ref 78.0–100.0)
Platelets: 237 10*3/uL (ref 150–400)
RBC: 3.39 MIL/uL — ABNORMAL LOW (ref 3.87–5.11)
RDW: 15.6 % — ABNORMAL HIGH (ref 11.5–15.5)
WBC: 7.3 10*3/uL (ref 4.0–10.5)

## 2014-09-02 LAB — TYPE AND SCREEN
ABO/RH(D): O POS
ANTIBODY SCREEN: NEGATIVE

## 2014-09-02 LAB — OB RESULTS CONSOLE HIV ANTIBODY (ROUTINE TESTING): HIV: NONREACTIVE

## 2014-09-02 MED ORDER — FAMOTIDINE 20 MG PO TABS: 20.0000 mg | ORAL_TABLET | Freq: Every day | ORAL | Status: DC

## 2014-09-02 MED ORDER — PANTOPRAZOLE SODIUM 40 MG PO TBEC
40.0000 mg | DELAYED_RELEASE_TABLET | Freq: Every day | ORAL | Status: DC
  Administered 2014-09-02 – 2014-09-06 (×5): 40 mg via ORAL
  Filled 2014-09-02 (×6): qty 1

## 2014-09-02 MED ORDER — HYDRALAZINE HCL 20 MG/ML IJ SOLN: 10.0000 mg | Freq: Once | INTRAMUSCULAR | Status: DC | PRN

## 2014-09-02 MED ORDER — LABETALOL HCL 5 MG/ML IV SOLN: 20.0000 mg | INTRAVENOUS | Status: DC | PRN

## 2014-09-02 MED ORDER — PRENATAL MULTIVITAMIN CH
1.0000 | ORAL_TABLET | Freq: Every day | ORAL | Status: DC
  Administered 2014-09-03: 1 via ORAL
  Filled 2014-09-02: qty 1

## 2014-09-02 MED ORDER — CALCIUM CARBONATE ANTACID 500 MG PO CHEW: 2.0000 | CHEWABLE_TABLET | ORAL | Status: DC | PRN

## 2014-09-02 MED ORDER — BETAMETHASONE SOD PHOS & ACET 6 (3-3) MG/ML IJ SUSP
12.0000 mg | INTRAMUSCULAR | Status: AC
  Administered 2014-09-02 – 2014-09-03 (×2): 12 mg via INTRAMUSCULAR
  Filled 2014-09-02: qty 2

## 2014-09-02 MED ORDER — ZOLPIDEM TARTRATE 5 MG PO TABS: 5.0000 mg | ORAL_TABLET | Freq: Every evening | ORAL | Status: DC | PRN

## 2014-09-02 MED ORDER — ACETAMINOPHEN 325 MG PO TABS: 650.0000 mg | ORAL_TABLET | ORAL | Status: DC | PRN

## 2014-09-02 MED ORDER — SODIUM CHLORIDE 0.9 % IJ SOLN: 3.0000 mL | Freq: Two times a day (BID) | INTRAMUSCULAR | Status: DC

## 2014-09-02 MED ORDER — ACETAMINOPHEN 325 MG PO TABS
650.0000 mg | ORAL_TABLET | ORAL | Status: DC | PRN
  Administered 2014-09-03: 650 mg via ORAL
  Filled 2014-09-02: qty 2

## 2014-09-02 MED ORDER — HYDRALAZINE HCL 20 MG/ML IJ SOLN: 10.0000 mg | Freq: Once | INTRAMUSCULAR | Status: AC | PRN

## 2014-09-02 MED ORDER — PRENATAL MULTIVITAMIN CH: 1.0000 | ORAL_TABLET | Freq: Every day | ORAL | Status: DC

## 2014-09-02 MED ORDER — SODIUM CHLORIDE 0.9 % IJ SOLN: 3.0000 mL | INTRAMUSCULAR | Status: DC | PRN

## 2014-09-02 MED ORDER — BETAMETHASONE SOD PHOS & ACET 6 (3-3) MG/ML IJ SUSP
12.0000 mg | INTRAMUSCULAR | Status: DC
Start: 2014-09-02 — End: 2014-09-02
  Filled 2014-09-02: qty 2

## 2014-09-02 MED ORDER — DOCUSATE SODIUM 100 MG PO CAPS: 100.0000 mg | ORAL_CAPSULE | Freq: Every day | ORAL | Status: DC

## 2014-09-02 MED ORDER — SODIUM CHLORIDE 0.9 % IV SOLN: 250.0000 mL | INTRAVENOUS | Status: DC | PRN

## 2014-09-02 MED ORDER — DOCUSATE SODIUM 100 MG PO CAPS
100.0000 mg | ORAL_CAPSULE | Freq: Every day | ORAL | Status: DC
  Administered 2014-09-03: 100 mg via ORAL
  Filled 2014-09-02: qty 1

## 2014-09-02 NOTE — H&P (Signed)
Whitney Barajas is a 34 y.o. female, [id number] at 72 1/7 weeks, admitted for mild pre-eclampsia--seen in office today with c/o HA, visual sx, and increased pedal edema.  BP 142/80, 142/78 on visit in am, with uncertain presentation.  PIH and PCR labs sent, with PIH labs overall WNL (uric acid 6.4), and PCR 0.28. Hgb 8.8 today.    Brought back to office late today for US--fetus transverse/oblique, with fetal head on maternal right.  BP 152/98.  Cervix closed, 50%, pp -4 per Dr. Estanislado Barajas.  Patient admitted for betamethasone course, BP monitoring, NST q shift, and MFM consult in am for consult on timing of delivery.  Patient Active Problem List   Diagnosis Date Noted  . Pre-eclampsia in third trimester 09/02/2014  . Grand multipara 09/02/2014  . Late prenatal care--18 weeks 09/02/2014  . Susceptible varicella 09/02/2014  . Hx of postpartum depression, currently pregnant 09/02/2014  . Large for gestational age (LGA)--hx x 2 09/02/2014  . Borderline hyperglycemia--Hgb A1C 5.6 at NOB 09/02/2014  . Allergy to penicillin 09/02/2014    History of present pregnancy: Patient entered care at 18 1/7 weeks.   EDC of 09/29/14 was established by LMP and in agreement with [place] at 18 weeks.   Anatomy scan:  18 1/7 weeks, with normal findings and an anterior/fundal placenta.   Additional [place] evaluations:   09/02/14--Transverse/oblique lie, with head on right, EFW 7+2, 86%ile, BPP 8/8, AFI 14.1, 50%ile. Significant prenatal events:  Reported "borderline diabetes" in past.  Hgb A1C 5.6 at NOB.  Normal early glucola, 177 on repeat glucola at 28 weeks, normal 3 hour GTT.  Had fetal echo due to family hx of ASD--normal.  Treated for back pain at 26 weeks with Flexeril. Last evaluation:  Today in office, as listed above.  OB History    Gravida Para Term Preterm AB TAB SAB Ectopic Multiple Living   2006--SVB, 39 weeks, 36 hour labor, 9+6, [facility] 2007--SAB, 13 weeks 2007--SAB, 7  weeks 2008--SVB, 36 weeks, 8 hour labor, 7+11, epidural, [name] 2010--40 weeks, 4 hour labor, 8+15, epidural, [name] 2012--39 weeks, 2 hour labor, 9+15, epidural, [name], Dr. Gaynell Barajas  Hx pp depression after all deliveries  Past Medical History  Diagnosis Date  . GDM (gestational diabetes mellitus)    Past Surgical History  Procedure Laterality Date  . Dilation and curettage of uterus    . Wisdom tooth extraction     Family History: Father ASD, HTN, CVA, acute renal dx; Mother melanoma; MGM Alzheimers, dementia  Social History:  has no tobacco, alcohol, and drug history on file.  Patient is Caucasian, of the [place] faith, high school educated, married to FOB Whitney Barajas), employed as a Glass blower/designer at [employer].   Prenatal Transfer Tool  Maternal Diabetes: No Genetic Screening: Declined Maternal Ultrasounds/Referrals: Abnormal:  Findings:   Other:Transverse/oblique presentation Fetal Ultrasounds or other Referrals:  Fetal echo WNL, done due to family hx congential heart anomaly Maternal Substance Abuse:  No Significant Maternal Medications:  None Significant Maternal Lab Results: Lab values include: Other: GBS pending from 4/21  TDAP Declined Flu Declined  ROS:  Mild HA, pedal edema, occasional visual spots, +FM  Allergies  Allergen Reactions  . Codeine   . Food     Any berries  . Penicillins        Height  (1.676 m), weight 103.42 kg (228 lb).  Chest clear Heart RRR without murmur Abd gravid, NT,  FH 38 cm Pelvic: closed, 50%, pp OOP on office exam, transverse/oblique on US today Ext: DTR 1+, no clonus, 1+ edema in LE  FHR: Category Category 1 UCs:  None  Prenatal labs: ABO, Rh:  O+ Antibody:  Neg Rubella:   Immune RPR:   NR HBsAg:   Neg HIV:   NR GBS:  Pending from today Sickle cell/Hgb electrophoresis:  NA Pap:  WNL 04/2014 GC:  Negative 04/16/14 Chlamydia:  Negative 04/16/14 Genetic screenings:  Declined Glucola:  Early glucola WNL, 2nd glucola elevated at 177,  with normal 3 hour GTT Other:   Hgb 11.4 at NOB, 10.2 at 28 weeks, 8.8 today Urine culture 95K mixed species 04/16/14, negative 12/17.       Assessment/Plan: IUP at 36 1/7 weeks Mild pre-eclampsia Fetal malpresentation--transverse/oblique PCN allergy--GBS pending from 09/02/14  Plan: Admit to Antenatal per consult with Dr. Estanislado Pandyivard and Dr. Normand Barajas Routine antenatal orders CBC, RPR, T&S on admission Repeat PIH labs in the am Betamethasone course MFM consult 09/03/14 for recommendations for delivery BP treatment per parameters NST q shift.  Whitney Barajas, Whitney Barajas, Whitney Barajas 09/02/2014, 6:54 PM

## 2014-09-03 ENCOUNTER — Inpatient Hospital Stay (HOSPITAL_COMMUNITY): Payer: No Typology Code available for payment source | Admitting: Anesthesiology

## 2014-09-03 ENCOUNTER — Ambulatory Visit (HOSPITAL_COMMUNITY): Payer: No Typology Code available for payment source

## 2014-09-03 DIAGNOSIS — D649 Anemia, unspecified: Secondary | ICD-10-CM

## 2014-09-03 DIAGNOSIS — K219 Gastro-esophageal reflux disease without esophagitis: Secondary | ICD-10-CM

## 2014-09-03 DIAGNOSIS — Z6837 Body mass index (BMI) 37.0-37.9, adult: Secondary | ICD-10-CM

## 2014-09-03 LAB — CBC WITH DIFFERENTIAL/PLATELET
BASOS ABS: 0 10*3/uL (ref 0.0–0.1)
Basophils Relative: 0 % (ref 0–1)
Eosinophils Absolute: 0 10*3/uL (ref 0.0–0.7)
Eosinophils Relative: 0 % (ref 0–5)
HEMATOCRIT: 28.9 % — AB (ref 36.0–46.0)
HEMOGLOBIN: 9 g/dL — AB (ref 12.0–15.0)
LYMPHS ABS: 0.9 10*3/uL (ref 0.7–4.0)
LYMPHS PCT: 9 % — AB (ref 12–46)
MCH: 24.9 pg — AB (ref 26.0–34.0)
MCHC: 31.1 g/dL (ref 30.0–36.0)
MCV: 80.1 fL (ref 78.0–100.0)
MONO ABS: 0.9 10*3/uL (ref 0.1–1.0)
Monocytes Relative: 8 % (ref 3–12)
Neutro Abs: 9.1 10*3/uL — ABNORMAL HIGH (ref 1.7–7.7)
Neutrophils Relative %: 83 % — ABNORMAL HIGH (ref 43–77)
Platelets: 268 10*3/uL (ref 150–400)
RBC: 3.61 MIL/uL — ABNORMAL LOW (ref 3.87–5.11)
RDW: 15.4 % (ref 11.5–15.5)
WBC: 11 10*3/uL — ABNORMAL HIGH (ref 4.0–10.5)

## 2014-09-03 LAB — COMPREHENSIVE METABOLIC PANEL
ALT: 13 U/L (ref 0–35)
AST: 19 U/L (ref 0–37)
Albumin: 3.1 g/dL — ABNORMAL LOW (ref 3.5–5.2)
Alkaline Phosphatase: 120 U/L — ABNORMAL HIGH (ref 39–117)
Anion gap: 8 (ref 5–15)
BILIRUBIN TOTAL: 0.4 mg/dL (ref 0.3–1.2)
BUN: 10 mg/dL (ref 6–23)
CO2: 21 mmol/L (ref 19–32)
Calcium: 8.6 mg/dL (ref 8.4–10.5)
Chloride: 106 mmol/L (ref 96–112)
Creatinine, Ser: 0.51 mg/dL (ref 0.50–1.10)
GFR calc Af Amer: >90 mL/min (ref >90)
Glucose, Bld: 112 mg/dL — ABNORMAL HIGH (ref 70–99)
Potassium: 4.7 mmol/L (ref 3.5–5.1)
SODIUM: 135 mmol/L (ref 135–145)
Total Protein: 6.5 g/dL (ref 6.0–8.3)

## 2014-09-03 LAB — CBC
HEMATOCRIT: 30.7 % — AB (ref 36.0–46.0)
Hemoglobin: 9.5 g/dL — ABNORMAL LOW (ref 12.0–15.0)
MCH: 24.6 pg — ABNORMAL LOW (ref 26.0–34.0)
MCHC: 30.9 g/dL (ref 30.0–36.0)
MCV: 79.5 fL (ref 78.0–100.0)
Platelets: 259 10*3/uL (ref 150–400)
RBC: 3.86 MIL/uL — AB (ref 3.87–5.11)
RDW: 15.4 % (ref 11.5–15.5)
WBC: 10.3 10*3/uL (ref 4.0–10.5)

## 2014-09-03 LAB — PROTEIN / CREATININE RATIO, URINE
CREATININE, URINE: 93 mg/dL
PROTEIN CREATININE RATIO: 0.94 — AB (ref 0.00–0.15)
Total Protein, Urine: 87 mg/dL

## 2014-09-03 LAB — RPR: RPR Ser Ql: NONREACTIVE

## 2014-09-03 LAB — HIV ANTIBODY (ROUTINE TESTING W REFLEX): HIV Screen 4th Generation wRfx: NONREACTIVE

## 2014-09-03 LAB — URIC ACID: URIC ACID, SERUM: 6.7 mg/dL (ref 2.4–7.0)

## 2014-09-03 LAB — LACTATE DEHYDROGENASE: LDH: 121 U/L (ref 94–250)

## 2014-09-03 LAB — OB RESULTS CONSOLE GBS: GBS: NEGATIVE

## 2014-09-03 LAB — GROUP B STREP BY PCR: Group B strep by PCR: NEGATIVE

## 2014-09-03 MED ORDER — OXYTOCIN 40 UNITS IN LACTATED RINGERS INFUSION - SIMPLE MED
62.5000 mL/h | INTRAVENOUS | Status: DC
  Administered 2014-09-04: 62.5 mL/h via INTRAVENOUS

## 2014-09-03 MED ORDER — PHENYLEPHRINE 40 MCG/ML (10ML) SYRINGE FOR IV PUSH (FOR BLOOD PRESSURE SUPPORT)
PREFILLED_SYRINGE | INTRAVENOUS | Status: AC
  Filled 2014-09-03: qty 20

## 2014-09-03 MED ORDER — FENTANYL 2.5 MCG/ML BUPIVACAINE 1/10 % EPIDURAL INFUSION (WH - ANES)
INTRAMUSCULAR | Status: AC
  Administered 2014-09-03: 14 mL/h via EPIDURAL
  Filled 2014-09-03: qty 125

## 2014-09-03 MED ORDER — EPHEDRINE 5 MG/ML INJ
10.0000 mg | INTRAVENOUS | Status: DC | PRN
  Filled 2014-09-03: qty 2

## 2014-09-03 MED ORDER — TERBUTALINE SULFATE 1 MG/ML IJ SOLN
0.2500 mg | Freq: Once | INTRAMUSCULAR | Status: AC
  Administered 2014-09-03: 0.25 mg via SUBCUTANEOUS

## 2014-09-03 MED ORDER — TERBUTALINE SULFATE 1 MG/ML IJ SOLN: 0.2500 mg | Freq: Once | INTRAMUSCULAR | Status: AC | PRN

## 2014-09-03 MED ORDER — MAGNESIUM SULFATE 50 % IJ SOLN: 4.0000 g/h | Freq: Once | INTRAVENOUS | Status: DC

## 2014-09-03 MED ORDER — MAGNESIUM SULFATE 50 % IJ SOLN: 2.0000 g/h | INTRAVENOUS | Status: DC

## 2014-09-03 MED ORDER — OXYTOCIN 40 UNITS IN LACTATED RINGERS INFUSION - SIMPLE MED
1.0000 m[IU]/min | INTRAVENOUS | Status: DC
  Administered 2014-09-03: 2 m[IU]/min via INTRAVENOUS
  Filled 2014-09-03: qty 1000

## 2014-09-03 MED ORDER — ONDANSETRON HCL 4 MG/2ML IJ SOLN
4.0000 mg | Freq: Three times a day (TID) | INTRAMUSCULAR | Status: DC | PRN
  Administered 2014-09-03: 4 mg via INTRAVENOUS
  Filled 2014-09-03: qty 2

## 2014-09-03 MED ORDER — SODIUM CHLORIDE 0.9 % IJ SOLN
3.0000 mL | Freq: Two times a day (BID) | INTRAMUSCULAR | Status: DC
  Administered 2014-09-03: 3 mL via INTRAVENOUS

## 2014-09-03 MED ORDER — PHENYLEPHRINE 40 MCG/ML (10ML) SYRINGE FOR IV PUSH (FOR BLOOD PRESSURE SUPPORT)
80.0000 ug | PREFILLED_SYRINGE | INTRAVENOUS | Status: DC | PRN
  Filled 2014-09-03: qty 2

## 2014-09-03 MED ORDER — ACETAMINOPHEN 325 MG PO TABS
650.0000 mg | ORAL_TABLET | ORAL | Status: DC | PRN
  Administered 2014-09-04: 650 mg via ORAL
  Filled 2014-09-03: qty 2

## 2014-09-03 MED ORDER — ONDANSETRON HCL 4 MG/2ML IJ SOLN: 4.0000 mg | Freq: Four times a day (QID) | INTRAMUSCULAR | Status: DC | PRN

## 2014-09-03 MED ORDER — MAGNESIUM SULFATE BOLUS VIA INFUSION
4.0000 g | Freq: Once | INTRAVENOUS | Status: AC
  Administered 2014-09-03: 4 g via INTRAVENOUS
  Filled 2014-09-03: qty 500

## 2014-09-03 MED ORDER — LACTATED RINGERS IV SOLN
INTRAVENOUS | Status: DC
  Administered 2014-09-03 – 2014-09-04 (×2): via INTRAVENOUS

## 2014-09-03 MED ORDER — CLINDAMYCIN PHOSPHATE 900 MG/50ML IV SOLN
900.0000 mg | Freq: Three times a day (TID) | INTRAVENOUS | Status: DC
  Administered 2014-09-03: 900 mg via INTRAVENOUS
  Filled 2014-09-03 (×2): qty 50

## 2014-09-03 MED ORDER — OXYTOCIN BOLUS FROM INFUSION
500.0000 mL | INTRAVENOUS | Status: DC
Start: 2014-09-03 — End: 2014-09-04

## 2014-09-03 MED ORDER — FENTANYL 2.5 MCG/ML BUPIVACAINE 1/10 % EPIDURAL INFUSION (WH - ANES)
14.0000 mL/h | INTRAMUSCULAR | Status: DC | PRN
  Administered 2014-09-03 (×2): 14 mL/h via EPIDURAL

## 2014-09-03 MED ORDER — TERBUTALINE SULFATE 1 MG/ML IJ SOLN
INTRAMUSCULAR | Status: AC
  Filled 2014-09-03: qty 1

## 2014-09-03 MED ORDER — LACTATED RINGERS IV SOLN
500.0000 mL | Freq: Once | INTRAVENOUS | Status: AC
  Administered 2014-09-03: 500 mL via INTRAVENOUS

## 2014-09-03 MED ORDER — LACTATED RINGERS IV SOLN: 500.0000 mL | INTRAVENOUS | Status: DC | PRN

## 2014-09-03 MED ORDER — CITRIC ACID-SODIUM CITRATE 334-500 MG/5ML PO SOLN
30.0000 mL | ORAL | Status: DC | PRN
Start: 2014-09-03 — End: 2014-09-04
  Filled 2014-09-03: qty 30

## 2014-09-03 MED ORDER — LIDOCAINE HCL (PF) 1 % IJ SOLN
INTRAMUSCULAR | Status: DC | PRN
  Administered 2014-09-03: 14 mL
  Administered 2014-09-03 (×2): 4 mL

## 2014-09-03 MED ORDER — MAGNESIUM SULFATE 50 % IJ SOLN
2.0000 g/h | INTRAVENOUS | Status: DC
  Administered 2014-09-03 – 2014-09-04 (×2): 2 g/h via INTRAVENOUS
  Filled 2014-09-03 (×2): qty 80

## 2014-09-03 MED ORDER — LACTATED RINGERS IV SOLN: INTRAVENOUS | Status: DC

## 2014-09-03 MED ORDER — DIPHENHYDRAMINE HCL 50 MG/ML IJ SOLN
12.5000 mg | INTRAMUSCULAR | Status: DC | PRN
  Filled 2014-09-03: qty 1

## 2014-09-03 MED ORDER — LIDOCAINE HCL (PF) 1 % IJ SOLN
30.0000 mL | INTRAMUSCULAR | Status: DC | PRN
  Filled 2014-09-03: qty 30

## 2014-09-03 NOTE — Progress Notes (Signed)
34 y.o. year old female,at 2723w1d gestation.  SUBJECTIVE:  The patient continues to have a headache after treatment with Tylenol.  OBJECTIVE:  BP 151/80 mmHg  Pulse 91  Temp(Src) 97.9 F (36.6 C) (Oral)  Resp 18  Ht 5\' 6"  (1.676 m)  Wt 226 lb 14.4 oz (102.921 kg)  BMI 36.64 kg/m2  Fetal Heart Tones:  Category 1  Contractions:          Few   ASSESSMENT:  923w1d Weeks Pregnancy  Preeclampsia with severe features  Oblique presentation  PLAN:  A long discussion was held with the patient and her family members about management of preeclampsia. We also discussed delivery options for an infant that is oblique in presentation. The risk and benefits of external version and cesarean delivery were reviewed. The patient has decided that she wants to try an external version. She would like to get an epidural prior to the external version. I think this is a reasonable option because the epidural will provide pain management for induction of labor or for cesarean section.  The patient last had a meal of a hamburger and french fries at 2:30 PM. If cesarean delivery is required, then we will need to delay the procedure for a reasonable period of time.  Leonard SchwartzArthur Vernon Priya Matsen, M.D.

## 2014-09-03 NOTE — Anesthesia Preprocedure Evaluation (Signed)
Anesthesia Evaluation  Patient identified by MRN, date of birth, ID band Patient awake    Reviewed: Allergy & Precautions, NPO status , Patient's Chart, lab work & pertinent test results  History of Anesthesia Complications Negative for: history of anesthetic complications  Airway Mallampati: II  TM Distance: >3 FB Neck ROM: Full    Dental no notable dental hx. (+) Dental Advisory Given   Pulmonary neg pulmonary ROS,  breath sounds clear to auscultation  Pulmonary exam normal       Cardiovascular hypertension (PreE with severe features), Rhythm:Regular Rate:Normal     Neuro/Psych negative neurological ROS  negative psych ROS   GI/Hepatic negative GI ROS, Neg liver ROS,   Endo/Other  diabetesobesity  Renal/GU negative Renal ROS  negative genitourinary   Musculoskeletal negative musculoskeletal ROS (+)   Abdominal   Peds negative pediatric ROS (+)  Hematology negative hematology ROS (+)   Anesthesia Other Findings   Reproductive/Obstetrics (+) Pregnancy                             Anesthesia Physical Anesthesia Plan  ASA: III  Anesthesia Plan: Epidural   Post-op Pain Management:    Induction:   Airway Management Planned:   Additional Equipment:   Intra-op Plan:   Post-operative Plan:   Informed Consent: I have reviewed the patients History and Physical, chart, labs and discussed the procedure including the risks, benefits and alternatives for the proposed anesthesia with the patient or authorized representative who has indicated his/her understanding and acceptance.   Dental advisory given  Plan Discussed with:   Anesthesia Plan Comments:         Anesthesia Quick Evaluation

## 2014-09-03 NOTE — Progress Notes (Signed)
V. Standard, CNM notified regarding pt's continued complaint of headache that's not been resolved with Tylenol.

## 2014-09-03 NOTE — Progress Notes (Signed)
Pt informed she's NPO and not to drink or eat anything from this point.  Pt verbalized understanding and agreeable.

## 2014-09-03 NOTE — Progress Notes (Signed)
Maternal Fetal Medicine Consult  34 year-old, [id number], at 36 weeks 1 day gestation with a pregnancy complicated by admission for elevated blood pressures in the office. She notes a 4 day history of headache (she states she does not get headaches) that didn't resolve with tylenol at home when it first started. 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 Her urine P/C ratio yesterday was borderline and today meets criteria for preeclampsia. Her laboratory assessment did not have abnormalities consistent with preeclampsia other than an increased uric acid and proteinuria. She did not have issues with preeclampsia or high blood pressure with an of her prior deliveries.   She notes that she lives over 45 minutes away from the hospital and does not have help with her 4 kids at home   She has received a course of antenatal steroids at [redacted] weeks gestation (second dose soon)  #1 Admission to rule out preeclampsia with severe features    - Recommend trying to see if the headache can be resolved through medications (although suspicious this will not help).     - If headache does not resolve with medication, she meets criteria for preeclampsia with severe features and should undergo induction/delivery with magnesium for seizure prophylaxis.     - If headache resolves completely with medications, she is not a good candidate for outpatient management of mild preeclampsia given distance from the hospital and no ability to maintain bedrest at home. She should have repeat laboratory evaluation for elevation in BP to the severe range or clinical symptoms consistent with preeclampsia. I would then keep her in the hospital until induction/delivery at [redacted] weeks gestation with mild preeclampsia, or sooner if signs/symptoms consistent with preeclampsia with severe features occur and induction/delivery with  magnesium prophylaxis.  #2 Fetal malpresentation    - Baby is currently non-cephalic, so is a potential candidate for ECV after appropriate counseling and consent and then induction if successful or primary cesarean if declines ECV or ECV is unsuccessful.   Questions appear answered to her satisfaction. Precautions for the above given. Spent greater than 1/2 of 25 minute visit face to face counseling.

## 2014-09-03 NOTE — Progress Notes (Signed)
Addendum GBS status unknown, result pending Consulted with Dr Stefano GaulStringer, given Clindamycin, DC is GBS in negative

## 2014-09-03 NOTE — Progress Notes (Signed)
Addendum Consulted with Dr Stringer on Whitney Gaulcare plan.  Possible plan external version the IOL vs primary CS.   Dr Whitney GaulStringer to talk to pt for her desire on plan of care

## 2014-09-03 NOTE — Op Note (Signed)
OPERATIVE NOTE  Whitney DellJessica L Barajas  DOB:    12/12/1980  MRN:    [id number]  CSN:    [id number]  Date of Surgery:  09/03/2014  Preoperative Diagnosis:  5416w1d  Oblique Presentation  Preeclampsia with severe features  Postoperative Diagnosis:  5516w1d  Vertex Presentation  Preeclampsia with severe features  Procedure:  External Version  Surgeon:  Leonard SchwartzArthur Vernon Julieana Eshleman, M.D.  Assistant:  Venous Mayford KnifeWilliams, CNM  Anesthetic:  Epidural  Disposition:   The patient presents with an infant with a oblique presentation documented by ultrasound. She denies bleeding and leakage of fluid. The treatment options were reviewed including observation, external version, and cesarean delivery. The risk and benefits were discussed. Questions were answered. The patient wants to proceed with an attempt at external version. She understands that fetal stress is a possibility, and that we may need to proceed today with cesarean delivery. She understands the indications for the procedure. She accepts the risk of, but not limited to, anesthetic complications, bleeding, infections, and possible damage to the surrounding organs.  Findings:  An ultrasound was performed and an oblique presentation was confirmed. The amniotic fluid volume was normal. Normal fetal heart motion was documented. There was no evidence of an abruption. The fetal head is located in the right upper quadrant.  Procedure:  The patient was evaluated on the labor and delivery suite. The fetal heart rate tracing is a category 1. The procedure was carefully explained to the patient and her family members. The risk and benefits were reviewed. A permit was signed. The patient was given 0.25 mg of subcutaneous terbutaline. An ultrasound was performed with findings as mentioned above. The patient's abdomen was covered with ultrasound gel. 4 attempts were made to rotate the infant in a counterclockwise direction. The fetal head was converted  to vertex on the fourth attempt.  The patient was monitored carefully with the ultrasound throughout her procedure. The was no evidence of fetal stress at any time. The patient was placed back on the nonstress test monitor for observation. The cervix was noted to be 3 cm dilated, 50% effaced, and -3 in station. The membranes were ruptured. There was no gross rupture but there was a small trickle of clear fluid. We will proceed with magnesium therapy for preeclampsia with severe features, and Pitocin augmentation of labor.  Leonard SchwartzArthur Vernon Ayame Rena, M.D.  09/03/2014 6:57 PM

## 2014-09-03 NOTE — Progress Notes (Addendum)
34 y.o. year old female,at 5366w1d gestation.  SUBJECTIVE:  The patient says that she feels well. She does have a headache. She denies blurred vision or right upper quadrant tenderness.  OBJECTIVE:  BP 149/66 mmHg  Pulse 81  Temp(Src) 98.2 F (36.8 C) (Oral)  Resp 18  Ht 5\' 6"  (1.676 m)  Wt 228 lb (103.42 kg)  BMI 36.82 kg/m2  Fetal Heart Tones:  Reactive nonstress test  Contractions:          Irregular contractions  Chest: Clear. Heart: Regular rate and rhythm. Abdomen: Gravid, nontender. Reflexes:IV/VI,1- 2 beats of clonus noted.  Results for orders placed or performed during the hospital encounter of 09/02/14 (from the past 24 hour(s))  CBC     Status: Abnormal   Collection Time: 09/02/14  6:48 PM  Result Value Ref Range   WBC 7.3 4.0 - 10.5 K/uL   RBC 3.39 (L) 3.87 - 5.11 MIL/uL   Hemoglobin 8.5 (L) 12.0 - 15.0 g/dL   HCT 16.127.0 (L) 09.636.0 - 04.546.0 %   MCV 79.6 78.0 - 100.0 fL   MCH 25.1 (L) 26.0 - 34.0 pg   MCHC 31.5 30.0 - 36.0 g/dL   RDW 40.915.6 (H) 81.111.5 - 91.415.5 %   Platelets 237 150 - 400 K/uL  RPR     Status: None   Collection Time: 09/02/14  6:48 PM  Result Value Ref Range   RPR Ser Ql Non Reactive Non Reactive  Type and screen     Status: None   Collection Time: 09/02/14  6:48 PM  Result Value Ref Range   ABO/RH(D) O POS    Antibody Screen NEG    Sample Expiration 09/05/2014   HIV antibody     Status: None   Collection Time: 09/02/14  6:48 PM  Result Value Ref Range   HIV Screen 4th Generation wRfx Non Reactive Non Reactive  Comprehensive metabolic panel     Status: Abnormal   Collection Time: 09/03/14  5:30 AM  Result Value Ref Range   Sodium 135 135 - 145 mmol/L   Potassium 4.7 3.5 - 5.1 mmol/L   Chloride 106 96 - 112 mmol/L   CO2 21 19 - 32 mmol/L   Glucose, Bld 112 (H) 70 - 99 mg/dL   BUN 10 6 - 23 mg/dL   Creatinine, Ser 7.820.51 0.50 - 1.10 mg/dL   Calcium 8.6 8.4 - 95.610.5 mg/dL   Total Protein 6.5 6.0 - 8.3 g/dL   Albumin 3.1 (L) 3.5 - 5.2 g/dL   AST  19 0 - 37 U/L   ALT 13 0 - 35 U/L   Alkaline Phosphatase 120 (H) 39 - 117 U/L   Total Bilirubin 0.4 0.3 - 1.2 mg/dL   GFR calc non Af Amer >90 >90 mL/min   GFR calc Af Amer >90 >90 mL/min   Anion gap 8 5 - 15  CBC     Status: Abnormal   Collection Time: 09/03/14  5:30 AM  Result Value Ref Range   WBC 10.3 4.0 - 10.5 K/uL   RBC 3.86 (L) 3.87 - 5.11 MIL/uL   Hemoglobin 9.5 (L) 12.0 - 15.0 g/dL   HCT 21.330.7 (L) 08.636.0 - 57.846.0 %   MCV 79.5 78.0 - 100.0 fL   MCH 24.6 (L) 26.0 - 34.0 pg   MCHC 30.9 30.0 - 36.0 g/dL   RDW 46.915.4 62.911.5 - 52.815.5 %   Platelets 259 150 - 400 K/uL  Lactate dehydrogenase     Status:  None   Collection Time: 09/03/14  5:30 AM  Result Value Ref Range   LDH 121 94 - 250 U/L  Uric acid     Status: None   Collection Time: 09/03/14  5:30 AM  Result Value Ref Range   Uric Acid, Serum 6.7 2.4 - 7.0 mg/dL  Protein / creatinine ratio, urine     Status: Abnormal   Collection Time: 09/03/14  5:58 AM  Result Value Ref Range   Creatinine, Urine 93.00 mg/dL   Total Protein, Urine 87 mg/dL   Protein Creatinine Ratio 0.94 (H) 0.00 - 0.15     ASSESSMENT:  [redacted]w[redacted]d Weeks Pregnancy  Elevated blood pressures in the third trimester. Now proteinuria.  Oblique presentation.  PLAN:  Maternal fetal medicine consult today. We will review their recommendations for further management of possible preeclampsia vs proteinuria in pregnancy. Second dose of betamethasone today. May need external version. The patient wants to go home if at all possible. She will remain out of work until further evaluation in the office.  Leonard Schwartz, M.D.

## 2014-09-03 NOTE — Anesthesia Procedure Notes (Addendum)
Epidural Patient location during procedure: OB Start time: 09/03/2014 6:15 PM  Staffing Anesthesiologist: Karie SchwalbeJUDD, Jacob Chamblee Performed by: anesthesiologist   Preanesthetic Checklist Completed: patient identified, site marked, surgical consent, pre-op evaluation, timeout performed, IV checked, risks and benefits discussed and monitors and equipment checked  Epidural Patient position: sitting Prep: site prepped and draped and DuraPrep Patient monitoring: continuous pulse ox and blood pressure Approach: midline Location: L3-L4 Injection technique: LOR saline  Needle:  Needle type: Tuohy  Needle gauge: 17 G Needle length: 9 cm and 9 Needle insertion depth: 6 cm Catheter type: closed end flexible Catheter size: 19 Gauge Catheter at skin depth: 10 cm Test dose: negative  Assessment Events: blood not aspirated, injection not painful, no injection resistance, negative IV test and no paresthesia  Additional Notes Patient identified. Risks/Benefits/Options discussed with patient including but not limited to bleeding, infection, nerve damage, paralysis, failed block, incomplete pain control, headache, blood pressure changes, nausea, vomiting, reactions to medication both or allergic, itching and postpartum back pain. Confirmed with bedside nurse the patient's most recent platelet count. Confirmed with patient that they are not currently taking any anticoagulation, have any bleeding history or any family history of bleeding disorders. Patient expressed understanding and wished to proceed. All questions were answered. Sterile technique was used throughout the entire procedure. Please see nursing notes for vital signs. Test dose was given through epidural catheter and negative prior to continuing to dose epidural or start infusion. Warning signs of high block given to the patient including shortness of breath, tingling/numbness in hands, complete motor block, or any concerning symptoms with instructions to  call for help. Patient was given instructions on fall risk and not to get out of bed. All questions and concerns addressed with instructions to call with any issues or inadequate analgesia.

## 2014-09-03 NOTE — Progress Notes (Signed)
IUPC not tracing, even after flushing, repositioning and rezeoring. Contractions able to palpate but do not trace.  Patient is very uncomfortable with the IUPC, so patient asks to have it removed especially since it is not working.

## 2014-09-03 NOTE — Progress Notes (Signed)
Pt c/o nausea, no vomiting.  Requesting meds

## 2014-09-03 NOTE — Progress Notes (Addendum)
Subjective: Assumed care of this 34 yo [id number] @ 36.1 wks transferred from antenatal unit this evening for ECV. Plan was to induce labor if ECV successful due to preeclampsia w/ severe features. Was informed that pt only wanted epidural for ECV. Just now pt stated she will continue with the epidural, but at its decreased rate. States told by anesthesia that she could press her button if she felt the need for more medication.  Reports feeling groggy from the Magnesium Sulfate and intense itching from the epidural, but reveals she would rather itch than be in pain.  Still with headache, rating it 5/10. Denies visual disturbances, RUQ pain, CP, SOB, weakness, N/V or VB. Reports +FM, leaking of fluid and indigestion.   Objective: BP 145/77 mmHg  Pulse 90  Temp(Src) 97.6 F (36.4 C) (Oral)  Resp 20  Ht  (1.676 m)  Wt 226 lb 14.4 oz (102.921 kg)  BMI 36.64 kg/m2  SpO2 100% I/O last 3 completed shifts: In: 2060 [P.O.:1260; I.V.:800] Out: 2595 [Urine:2595] Total I/O In: 990.6 [P.O.:480; I.V.:460.6; IV Piggyback:50] Out: 250 [Urine:250]  Today's Vitals   09/03/14 2031 09/03/14 2112 09/03/14 2130 09/03/14 2200  BP: 130/66 142/69 143/78 145/77  Pulse: 81 81 84 90  Temp:      TempSrc:      Resp: Height:      Weight:      SpO2:      PainSc:   5     Results for orders placed or performed during the hospital encounter of 09/02/14 (from the past 24 hour(s))  Comprehensive metabolic panel     Status: Abnormal   Collection Time: 09/03/14  5:30 AM  Result Value Ref Range   Sodium 135 135 - 145 mmol/L   Potassium 4.7 3.5 - 5.1 mmol/L   Chloride 106 96 - 112 mmol/L   CO2 21 19 - 32 mmol/L   Glucose, Bld 112 (H) 70 - 99 mg/dL   BUN 10 6 - 23 mg/dL   Creatinine, Ser 4.54 0.50 - 1.10 mg/dL   Calcium 8.6 8.4 - 09.8 mg/dL   Total Protein 6.5 6.0 - 8.3 g/dL   Albumin 3.1 (L) 3.5 - 5.2 g/dL   AST 19 0 - 37 U/L   ALT 13 0 - 35 U/L   Alkaline Phosphatase 120 (H) 39 - 117 U/L    Total Bilirubin 0.4 0.3 - 1.2 mg/dL   GFR calc non Af Amer >90 >90 mL/min   GFR calc Af Amer >90 >90 mL/min   Anion gap 8 5 - 15  CBC     Status: Abnormal   Collection Time: 09/03/14  5:30 AM  Result Value Ref Range   WBC 10.3 4.0 - 10.5 K/uL   RBC 3.86 (L) 3.87 - 5.11 MIL/uL   Hemoglobin 9.5 (L) 12.0 - 15.0 g/dL   HCT 11.9 (L) 14.7 - 82.9 %   MCV 79.5 78.0 - 100.0 fL   MCH 24.6 (L) 26.0 - 34.0 pg   MCHC 30.9 30.0 - 36.0 g/dL   RDW 56.2 13.0 - 86.5 %   Platelets 259 150 - 400 K/uL  Lactate dehydrogenase     Status: None   Collection Time: 09/03/14  5:30 AM  Result Value Ref Range   LDH 121 94 - 250 U/L  Uric acid     Status: None   Collection Time: 09/03/14  5:30 AM  Result Value Ref Range   Uric Acid, Serum 6.7  2.4 - 7.0 mg/dL  Protein / creatinine ratio, urine     Status: Abnormal   Collection Time: 09/03/14  5:58 AM  Result Value Ref Range   Creatinine, Urine 93.00 mg/dL   Total Protein, Urine 87 mg/dL   Protein Creatinine Ratio 0.94 (H) 0.00 - 0.15  CBC with Differential/Platelet     Status: Abnormal   Collection Time: 09/03/14  5:25 PM  Result Value Ref Range   WBC 11.0 (H) 4.0 - 10.5 K/uL   RBC 3.61 (L) 3.87 - 5.11 MIL/uL   Hemoglobin 9.0 (L) 12.0 - 15.0 g/dL   HCT 16.128.9 (L) 09.636.0 - 04.546.0 %   MCV 80.1 78.0 - 100.0 fL   MCH 24.9 (L) 26.0 - 34.0 pg   MCHC 31.1 30.0 - 36.0 g/dL   RDW 40.915.4 81.111.5 - 91.415.5 %   Platelets 268 150 - 400 K/uL   Neutrophils Relative % 83 (H) 43 - 77 %   Neutro Abs 9.1 (H) 1.7 - 7.7 K/uL   Lymphocytes Relative 9 (L) 12 - 46 %   Lymphs Abs 0.9 0.7 - 4.0 K/uL   Monocytes Relative 8 3 - 12 %   Monocytes Absolute 0.9 0.1 - 1.0 K/uL   Eosinophils Relative 0 0 - 5 %   Eosinophils Absolute 0.0 0.0 - 0.7 K/uL   Basophils Relative 0 0 - 1 %   Basophils Absolute 0.0 0.0 - 0.1 K/uL  Group B strep by PCR     Status: None   Collection Time: 09/03/14  8:10 PM  Result Value Ref Range   Group B strep by PCR NEGATIVE NEGATIVE   Gen: NAD Lungs:  CTAB CV: RRR w/o murmur Abdomen: gravid, soft between ctxs, NT EFW: 7+2 on 09/02/14 scan SVE: 3/50/-3 Cephalic by Leopolds and VE Bishop score: 8 Ext: Bilateral pedal edema, 2+ bilateral brachial reflexes, 2 beat clonus bilaterally FHT: BL 148 w/ moderate variability, +accels, intermittent sharp variables with quick recovery, no lates UC:   irregular, every 4-7 minutes Pitocin at 8 mius/min Obvious fluid noted on towel Received one dose of Clindamycin at 20:28 PM Adequate uop, > 100 ml/hr  Assessment:  34 yo [id number] @ 36.1 wks IOL per MFM recs due to preeclampsia w/ severe features Successful ECV Stable BPs GBS neg Cat 1 FHRT Anemia Favorable cvx Cephalic presentation Elevated BMI (37) GERD Steroid complete  Plan: Continue w/ current plan - Magnesium Sulfate - Pitocin - D/C Clindamycin Reviewed R/B of IUPC - pt consented - placed w/o difficulty at 22:03 PM Protonix Consult prn Ferrous Sulfate post delivery Expect progress and SVD  [name], Sejla Marzano CNM 09/03/2014, 10:20 PM

## 2014-09-04 ENCOUNTER — Encounter (HOSPITAL_COMMUNITY): Payer: Self-pay

## 2014-09-04 LAB — CBC
HCT: 28.5 % — ABNORMAL LOW (ref 36.0–46.0)
Hemoglobin: 8.9 g/dL — ABNORMAL LOW (ref 12.0–15.0)
MCH: 25 pg — AB (ref 26.0–34.0)
MCHC: 31.2 g/dL (ref 30.0–36.0)
MCV: 80.1 fL (ref 78.0–100.0)
PLATELETS: 275 10*3/uL (ref 150–400)
RBC: 3.56 MIL/uL — ABNORMAL LOW (ref 3.87–5.11)
RDW: 15.7 % — ABNORMAL HIGH (ref 11.5–15.5)
WBC: 14 10*3/uL — ABNORMAL HIGH (ref 4.0–10.5)

## 2014-09-04 LAB — RPR: RPR Ser Ql: NONREACTIVE

## 2014-09-04 LAB — MRSA PCR SCREENING: MRSA by PCR: NEGATIVE

## 2014-09-04 MED ORDER — ACETAMINOPHEN 325 MG PO TABS: 650.0000 mg | ORAL_TABLET | ORAL | Status: DC | PRN

## 2014-09-04 MED ORDER — LANOLIN HYDROUS EX OINT: TOPICAL_OINTMENT | CUTANEOUS | Status: DC | PRN

## 2014-09-04 MED ORDER — SIMETHICONE 80 MG PO CHEW: 80.0000 mg | CHEWABLE_TABLET | ORAL | Status: DC | PRN

## 2014-09-04 MED ORDER — BENZOCAINE-MENTHOL 20-0.5 % EX AERO: 1.0000 "application " | INHALATION_SPRAY | CUTANEOUS | Status: DC | PRN

## 2014-09-04 MED ORDER — DIBUCAINE 1 % RE OINT: 1.0000 "application " | TOPICAL_OINTMENT | RECTAL | Status: DC | PRN

## 2014-09-04 MED ORDER — DIPHENHYDRAMINE HCL 25 MG PO CAPS: 25.0000 mg | ORAL_CAPSULE | Freq: Four times a day (QID) | ORAL | Status: DC | PRN

## 2014-09-04 MED ORDER — WITCH HAZEL-GLYCERIN EX PADS: 1.0000 "application " | MEDICATED_PAD | CUTANEOUS | Status: DC | PRN

## 2014-09-04 MED ORDER — SENNOSIDES-DOCUSATE SODIUM 8.6-50 MG PO TABS
2.0000 | ORAL_TABLET | ORAL | Status: DC
  Administered 2014-09-04 – 2014-09-05 (×2): 2 via ORAL
  Filled 2014-09-04: qty 2

## 2014-09-04 MED ORDER — OXYCODONE-ACETAMINOPHEN 5-325 MG PO TABS: 2.0000 | ORAL_TABLET | ORAL | Status: DC | PRN

## 2014-09-04 MED ORDER — PRENATAL MULTIVITAMIN CH
1.0000 | ORAL_TABLET | Freq: Every day | ORAL | Status: DC
  Administered 2014-09-04 – 2014-09-06 (×3): 1 via ORAL
  Filled 2014-09-04 (×3): qty 1

## 2014-09-04 MED ORDER — OXYCODONE-ACETAMINOPHEN 5-325 MG PO TABS: 1.0000 | ORAL_TABLET | ORAL | Status: DC | PRN

## 2014-09-04 MED ORDER — ONDANSETRON HCL 4 MG/2ML IJ SOLN: 4.0000 mg | INTRAMUSCULAR | Status: DC | PRN

## 2014-09-04 MED ORDER — ONDANSETRON HCL 4 MG PO TABS: 4.0000 mg | ORAL_TABLET | ORAL | Status: DC | PRN

## 2014-09-04 MED ORDER — FERROUS SULFATE 325 (65 FE) MG PO TABS
325.0000 mg | ORAL_TABLET | Freq: Two times a day (BID) | ORAL | Status: DC
  Administered 2014-09-04 – 2014-09-06 (×5): 325 mg via ORAL
  Filled 2014-09-04 (×5): qty 1

## 2014-09-04 MED ORDER — ZOLPIDEM TARTRATE 5 MG PO TABS: 5.0000 mg | ORAL_TABLET | Freq: Every evening | ORAL | Status: DC | PRN

## 2014-09-04 MED ORDER — IBUPROFEN 600 MG PO TABS
600.0000 mg | ORAL_TABLET | Freq: Four times a day (QID) | ORAL | Status: DC
  Administered 2014-09-04 – 2014-09-06 (×9): 600 mg via ORAL
  Filled 2014-09-04 (×9): qty 1

## 2014-09-04 MED ORDER — TETANUS-DIPHTH-ACELL PERTUSSIS 5-2.5-18.5 LF-MCG/0.5 IM SUSP
0.5000 mL | Freq: Once | INTRAMUSCULAR | Status: DC
  Filled 2014-09-04: qty 0.5

## 2014-09-04 NOTE — Progress Notes (Signed)
  Subjective: Frustrated with process - thought she would have delivered by now. Crying with each ctx. Per anesthesia, ok for pt to push button every 10 min. Pt requested that IUPC be removed due to discomfort. Contractions were very difficult to trace externally, but are tracing adequately now.   States headache persists at 5/10. Denies any other sxs of preeclampsia. +FM. No VB.   Objective: BP 112/63 mmHg  Pulse 80  Temp(Src) 97.5 F (36.4 C) (Oral)  Resp 18  Ht 5\' 6"  (1.676 m)  Wt 226 lb 14.4 oz (102.921 kg)  BMI 36.64 kg/m2  SpO2 100% I/O last 3 completed shifts: In: 2060 [P.O.:1260; I.V.:800] Out: 2595 [Urine:2595] Total I/O In: 2594.6 [P.O.:840; I.V.:1704.6; IV Piggyback:50] Out: 1800 [Urine:1800]   Today's Vitals   09/04/14 0330 09/04/14 0400 09/04/14 0430 09/04/14 0450  BP: 128/77 130/81 112/63   Pulse: 89 83 80   Temp:    97.5 F (36.4 C)  TempSrc:    Oral  Resp:  18    Height:      Weight:      SpO2:      PainSc:  7       FHT: BL 140 w/ moderate variability, occ accel, earlys, intermittent variables and lates resolved w/ position change  UC:   irregular, every 3-5 minutes Pitocin at 20 mius/min 2+ brachial reflexes, pedal edema  Assessment:  IOL due to preeclampsia w/ severe features Cat 2 - reverted to Cat 1 w/ intrauterine resuscitative measures Active labor GBS neg   Plan: Reiterated process of induction. Explained that her contractions have not been adequate despite our best efforts with and without an IUPC, however in order to continue w/ Pitocin induction, internal monitoring is necessary to safely increase Pitocin. She verbalized understanding and verbal consent for replacement obtained. Will hold off on increasing Pitocin until after IUPC placed.   [name], Juanetta Negash CNM 09/04/2014, 01:48 AM   ADDENDUM: Per RN, cvx 6/75/0. Since making adequate change and increased level of discomfort, will hold off on IUPC at this time and maintain Pitocin  at 20 mius/min.  Sherre ScarletKimberly Gao Mitnick, CNM 09/04/14, 05:00 AM   ADDENDUM: Now with uncontrolled urge to push. Cvx C/C/+2 per RN Prepare for imminent delivery  Sherre ScarletKimberly Elene Downum, CNM 09/04/14, 05:20 AM

## 2014-09-04 NOTE — Progress Notes (Signed)
Assumed care of patient at this time.  Received brief report from [place]oke, [place]

## 2014-09-04 NOTE — Lactation Note (Signed)
This note was copied from the chart of Boy Katelin Ricklefs. Lactation Consultation Note  Patient Name: Boy Leanor KailJessica Hainline WUJWJ'XToday's Date: 09/04/2014 Reason for consult: Initial assessment   Initial consult at 10 hours old for GA 36.2 LPTI; BW 7 lbs, 1.9 oz.   Mom is in AICU on Mag.; borderline GDM. Initial serum blood sugar 26 at 0743 but has increased to 47 @ 1034 and 43 @ 1251.   Mom is a P5 who is still breastfeeding previous child who is 234 yrs old and still has milk.  LS by RN -10. Both mom and RN report hearing swallows. Infant has breastfed x4 (10-25 min); voids-3; stools-0 in past 10 hours.   [facility] and was lying in bed with mom STS.  Mom awake and alert.   Green LPTI sheet given to mom and reviewed typical LPTI behavior.  Mom reports hearing swallows.  Mom reports infant is actively feeding.   Mom has a DEBP at home from previous pregnancies but will also be calling insurance company to get new DEBP Medela for this baby.   Lactation brochure given and informed of hospital support group and outpatient services.  [facility]  Encouraged to call for assistance as needed.     Maternal Data Formula Feeding for Exclusion: No Does the patient have breastfeeding experience prior to this delivery?: Yes  Feeding Feeding Type: Breast Fed Length of feed: 15 min  LATCH Score/Interventions                      Lactation Tools Discussed/Used WIC Program: Yes [facility] [facility]   Consult Status Consult Status: Follow-up Date: 09/05/14 Follow-up type: In-patient    Lendon KaVann, Jarrid Lienhard Walker 09/04/2014, 4:23 PM

## 2014-09-04 NOTE — Progress Notes (Signed)
Whitney Barajas   Subjective: Post Partum Day 0 Vaginal delivery, no laceration Patient up ad lib, denies syncope or dizziness. Reports consuming regular diet without issues and denies N/V No issues with urination and reports bleeding is appropriate  Feeding:  breast Contraceptive plan:   micronor  Objective: Temp:  [97.5 F (36.4 C)-98.2 F (36.8 C)] 97.5 F (36.4 C) (04/23 0450) Pulse Rate:  [69-123] 77 (04/23 0739) Resp:  [18-20] 18 (04/23 0400) BP: (107-151)/(37-90) 120/68 mmHg (04/23 0739) SpO2:  [99 %-100 %] 100 % (04/22 1831) Weight:  [226 lb 14.4 oz (102.921 kg)] 226 lb 14.4 oz (102.921 kg) (04/22 0829)  Physical Exam:  General: alert and cooperative Ext: WNL, no edema. No evidence of DVT seen on physical exam. Breast: Soft filling Lungs: CTAB Heart RRR without murmur  Abdomen:  Soft, fundus firm, lochia scant, + bowel sounds, non distended, non tender Lochia: appropriate Uterine Fundus: firm Laceration: healing well    Recent Labs  09/03/14 1725 09/04/14 0628  HGB 9.0* 8.9*  HCT 28.9* 28.5*    Assessment S/P Vaginal Delivery-Day 0 Stable  Normal Involution Breastfeeding Circumcision: in patient  Plan: Continue current care Breastfeeding and Lactation consult Lactation support  iron supplement  Carmeline Kowal, CNM, MSN 09/04/2014, 8:13 AM

## 2014-09-04 NOTE — Anesthesia Postprocedure Evaluation (Signed)
Anesthesia Post Note  Patient: Whitney Barajas  Procedure(s) Performed: * No procedures listed *  Anesthesia type: Epidural  Patient location: AICU  Post pain: Pain level controlled  Post assessment: Post-op Vital signs reviewed  Last Vitals:  Filed Vitals:   09/04/14 1244  BP: 131/69  Pulse: 83  Temp: 36.6 C  Resp: 18    Post vital signs: Reviewed  Level of consciousness:alert  Complications: No apparent anesthesia complications

## 2014-09-05 LAB — COMPREHENSIVE METABOLIC PANEL
ALT: 12 U/L (ref 0–35)
AST: 20 U/L (ref 0–37)
Albumin: 2.6 g/dL — ABNORMAL LOW (ref 3.5–5.2)
Alkaline Phosphatase: 85 U/L (ref 39–117)
Anion gap: 7 (ref 5–15)
BUN: 11 mg/dL (ref 6–23)
CALCIUM: 7.3 mg/dL — AB (ref 8.4–10.5)
CO2: 24 mmol/L (ref 19–32)
Chloride: 107 mmol/L (ref 96–112)
Creatinine, Ser: 0.63 mg/dL (ref 0.50–1.10)
GFR calc non Af Amer: >90 mL/min (ref >90)
GLUCOSE: 107 mg/dL — AB (ref 70–99)
Potassium: 4.4 mmol/L (ref 3.5–5.1)
Sodium: 138 mmol/L (ref 135–145)
Total Bilirubin: 0.3 mg/dL (ref 0.3–1.2)
Total Protein: 5.4 g/dL — ABNORMAL LOW (ref 6.0–8.3)

## 2014-09-05 LAB — CBC
HCT: 24.7 % — ABNORMAL LOW (ref 36.0–46.0)
HEMOGLOBIN: 7.5 g/dL — AB (ref 12.0–15.0)
MCH: 24.9 pg — ABNORMAL LOW (ref 26.0–34.0)
MCHC: 30.4 g/dL (ref 30.0–36.0)
MCV: 82.1 fL (ref 78.0–100.0)
PLATELETS: 230 10*3/uL (ref 150–400)
RBC: 3.01 MIL/uL — ABNORMAL LOW (ref 3.87–5.11)
RDW: 16.1 % — ABNORMAL HIGH (ref 11.5–15.5)
WBC: 10.6 10*3/uL — ABNORMAL HIGH (ref 4.0–10.5)

## 2014-09-05 LAB — URIC ACID: URIC ACID, SERUM: 7.1 mg/dL — AB (ref 2.4–7.0)

## 2014-09-05 NOTE — Progress Notes (Signed)
Whitney Barajas   Subjective: Post Partum Day 1 Vaginal delivery, no laceration Patient up ad lib, denies syncope, SOB, chest pain, or dizziness. Reports consuming regular diet without issues and denies N/V No issues with urination and reports bleeding is appropriate  Feeding:  breast Contraceptive plan:   micronor Report still breastfeeding 34 yo  Objective: Temp:  [97.7 F (36.5 C)-98.3 F (36.8 C)] 98.3 F (36.8 C) (04/24 0400) Pulse Rate:  [45-86] 61 (04/24 0700) Resp:  [16-20] 18 (04/24 0615) BP: (107-146)/(56-91) 141/91 mmHg (04/24 0700) SpO2:  [97 %-100 %] 99 % (04/24 0700) Weight:  [220 lb 14.4 oz (100.2 kg)] 220 lb 14.4 oz (100.2 kg) (04/23 0805) Filed Vitals:   09/05/14 0700 09/05/14 0800 09/05/14 0846 09/05/14 0849  BP: 141/91 128/71    Pulse: 61 68    Temp:    98.1 F (36.7 C)  TempSrc:    Oral  Resp:  16  18  Height:      Weight:      SpO2: 99% 97% 100%    Physical Exam:  General: alert and cooperative,  Ext: WNL, no edema. No evidence of DVT seen on physical exam. Breast: Soft filling Lungs: CTAB Heart RRR without murmur  Abdomen:  Soft, fundus firm, lochia scant, + bowel sounds, non distended, non tender Lochia: appropriate Uterine Fundus: firm Laceration: n/a    Recent Labs  09/04/14 0628 09/05/14 0507  HGB 8.9* 7.5*  HCT 28.5* 24.7*    Assessment S/P Vaginal Delivery-Day 1 Stable  Normal Involution Breastfeeding Circumcision: in patient Discussed blood transfusion, declined Orthostatic BPs wnl   Plan: Continue current care Plan for discharge tomorrow, Breastfeeding and Circumcision prior to discharge  Lactation support    Whitney Barajas, CNM, MSN 09/05/2014, 7:17 AM

## 2014-09-05 NOTE — Discharge Summary (Deleted)
Vaginal Delivery Discharge Summary  ALL information will be verified prior to discharge  Whitney Barajas  DOB:    05/18/1980 MRN:    [id number] CSN:    [id number]  Date of admission:                  09/02/14  Date of discharge:                   09/06/14  Procedures this admission: SVD  Date of Delivery: 09/04/14  Newborn Data:  Live born  Information for the patient's newborn:  Eduarda, Scrivens [[id number]]  female   Live born female  Birth Weight: 7 lb 1.9 oz (3229 g) APGAR: 8, 9  Home with mother. Name: Whitney Barajas Circumcision Plan: in patient   History of Present Illness: Ms. Whitney Barajas is a 34 y.o. female, [id number], who presents at [redacted]w[redacted]d weeks gestation. The patient has been followed at the [facility] and Gynecology division of [facility]. She was admitted IOL secondary to pre eclampsia. Her pregnancy has been complicated by:  Patient Active Problem List   Diagnosis Date Noted  . Vaginal delivery 09/04/2014  . BMI 37.0-37.9, adult 09/03/2014  . Anemia 09/03/2014  . GERD (gastroesophageal reflux disease) 09/03/2014  . Pre-eclampsia in third trimester 09/02/2014  . Grand multipara 09/02/2014  . Late prenatal care--18 weeks 09/02/2014  . Susceptible varicella 09/02/2014  . Hx of postpartum depression, currently pregnant 09/02/2014  . Large for gestational age (LGA)--hx x 2 09/02/2014  . Borderline hyperglycemia--Hgb A1C 5.6 at NOB 09/02/2014  . Allergy to penicillin - Shortness of Breath 09/02/2014    Hospital course: The patient was admitted as an AP for pre eclampsia leading to IOL SP external version.   Her labor was not complicated. She proceeded to have a vaginal delivery of a healthy infant. Her delivery was not complicated. Her postpartum course was complicated - Mag x 24 hours postpartum. She was discharged to home on postpartum day 2 doing well.  Feeding: breast  Contraception: oral progesterone-only  contraceptive   Discharge hemoglobin: HEMOGLOBIN  Date Value Ref Range Status  09/05/2014 7.5* 12.0 - 15.0 g/dL Final   HCT  Date Value Ref Range Status  09/05/2014 24.7* 36.0 - 46.0 % Final    PreNatal Labs ABO, Rh: --/--/O POS (04/21 1848)   Antibody: NEG (04/21 1848) Rubella:    immune RPR: Non Reactive (04/22 1725)  HBsAg: Negative (12/04 0000)  HIV: Non-reactive (04/21 0000)  GBS: Negative (04/22 0000)  Discharge Physical Exam:  General: alert and cooperative Lochia: appropriate Uterine Fundus: firm Incision: healing well DVT Evaluation: No evidence of DVT seen on physical exam.  Intrapartum Procedures: spontaneous vaginal delivery Postpartum Procedures: none Complications-Operative and Postpartum: none  Discharge Diagnoses: preterm delivery,  asymptomatic anemia  Activity:           pelvic rest Diet:                routine Medications: PNV, Ibuprofen and Iron labetalol Condition:      stable     Postpartum Teaching: Nutrition, exercise, return to work or school, family visits, sexual activity, home rest, vaginal bleeding, pelvic rest, family planning, s/s of PPD, breast care peri-care and incision care   Discharge to: home, smart start nurse to visit on Wednesday for BP check     Trenee Igoe, CNM, MSN 09/05/2014. 7:21 AM  All information will be verified prior to discharge   Postpartum Care  After Vaginal Delivery  After you deliver your newborn (postpartum period), the usual stay in the hospital is 24 72 hours. If there were problems with your labor or delivery, or if you have other medical problems, you might be in the hospital longer.  While you are in the hospital, you will receive help and instructions on how to care for yourself and your newborn during the postpartum period.  While you are in the hospital:  Be sure to tell your nurses if you have pain or discomfort, as well as where you feel the pain and what makes the pain worse.  If you  had an incision made near your vagina (episiotomy) or if you had some tearing during delivery, the nurses may put ice packs on your episiotomy or tear. The ice packs may help to reduce the pain and swelling.  If you are breastfeeding, you may feel uncomfortable contractions of your uterus for a couple of weeks. This is normal. The contractions help your uterus get back to normal size.  It is normal to have some bleeding after delivery.  For the first 1 3 days after delivery, the flow is red and the amount may be similar to a period.  It is common for the flow to start and stop.  In the first few days, you may pass some small clots. Let your nurses know if you begin to pass large clots or your flow increases.  Do not  flush blood clots down the toilet before having the nurse look at them.  During the next 3 10 days after delivery, your flow should become more watery and pink or brown-tinged in color.  Ten to fourteen days after delivery, your flow should be a small amount of yellowish-white discharge.  The amount of your flow will decrease over the first few weeks after delivery. Your flow may stop in 6 8 weeks. Most women have had their flow stop by 12 weeks after delivery.  You should change your sanitary pads frequently.  Wash your hands thoroughly with soap and water for at least 20 seconds after changing pads, using the toilet, or before holding or feeding your newborn.  You should feel like you need to empty your bladder within the first 6 8 hours after delivery.  In case you become weak, lightheaded, or faint, call your nurse before you get out of bed for the first time and before you take a shower for the first time.  Within the first few days after delivery, your breasts may begin to feel tender and full. This is called engorgement. Breast tenderness usually goes away within 48 72 hours after engorgement occurs. You may also notice milk leaking from your breasts. If you are not  breastfeeding, do not stimulate your breasts. Breast stimulation can make your breasts produce more milk.  Spending as much time as possible with your newborn is very important. During this time, you and your newborn can feel close and get to know each other. Having your newborn stay in your room (rooming in) will help to strengthen the bond with your newborn. It will give you time to get to know your newborn and become comfortable caring for your newborn.  Your hormones change after delivery. Sometimes the hormone changes can temporarily cause you to feel sad or tearful. These feelings should not last more than a few days. If these feelings last longer than that, you should talk to your caregiver.  If desired, talk to your caregiver about  methods of family planning or contraception.  Talk to your caregiver about immunizations. Your caregiver may want you to have the following immunizations before leaving the hospital:  Tetanus, diphtheria, and pertussis (Tdap) or tetanus and diphtheria (Td) immunization. It is very important that you and your family (including grandparents) or others caring for your newborn are up-to-date with the Tdap or Td immunizations. The Tdap or Td immunization can help protect your newborn from getting ill.  Rubella immunization.  Varicella (chickenpox) immunization.  Influenza immunization. You should receive this annual immunization if you did not receive the immunization during your pregnancy. Document Released: 02/25/2007 Document Revised: 01/23/2012 Document Reviewed: 12/26/2011 [facility] Patient Information 2014 [place], [place].   Postpartum Depression and Baby Blues  The postpartum period begins right after the birth of a baby. During this time, there is often a great amount of joy and excitement. It is also a time of considerable changes in the life of the parent(s). Regardless of how many times a mother gives birth, each child brings new challenges and  dynamics to the family. It is not unusual to have feelings of excitement accompanied by confusing shifts in moods, emotions, and thoughts. All mothers are at risk of developing postpartum depression or the "baby blues." These mood changes can occur right after giving birth, or they may occur many months after giving birth. The baby blues or postpartum depression can be mild or severe. Additionally, postpartum depression can resolve rather quickly, or it can be a long-term condition. CAUSES Elevated hormones and their rapid decline are thought to be a main cause of postpartum depression and the baby blues. There are a number of hormones that radically change during and after pregnancy. Estrogen and progesterone usually decrease immediately after delivering your baby. The level of thyroid hormone and various cortisol steroids also rapidly drop. Other factors that play a major role in these changes include major life events and genetics.  RISK FACTORS If you have any of the following risks for the baby blues or postpartum depression, know what symptoms to watch out for during the postpartum period. Risk factors that may increase the likelihood of getting the baby blues or postpartum depression include: 1. Havinga personal or family history of depression. 2. Having depression while being pregnant. 3. Having premenstrual or oral contraceptive-associated mood issues. 4. Having exceptional life stress. 5. Having marital conflict. 6. Lacking a social support network. 7. Having a baby with special needs. 8. Having health problems such as diabetes. SYMPTOMS Baby blues symptoms include:  Brief fluctuations in mood, such as going from extreme happiness to sadness.  Decreased concentration.  Difficulty sleeping.  Crying spells, tearfulness.  Irritability.  Anxiety. Postpartum depression symptoms typically begin within the first month after giving birth. These symptoms include:  Difficulty sleeping  or excessive sleepiness.  Marked weight loss.  Agitation.  Feelings of worthlessness.  Lack of interest in activity or food. Postpartum psychosis is a very concerning condition and can be dangerous. Fortunately, it is rare. Displaying any of the following symptoms is cause for immediate medical attention. Postpartum psychosis symptoms include:  Hallucinations and delusions.  Bizarre or disorganized behavior.  Confusion or disorientation. DIAGNOSIS  A diagnosis is made by an evaluation of your symptoms. There are no medical or lab tests that lead to a diagnosis, but there are various questionnaires that a caregiver may use to identify those with the baby blues, postpartum depression, or psychosis. Often times, a screening tool called the [place] Postnatal Depression Scale is used  to diagnose depression in the postpartum period.  TREATMENT The baby blues usually goes away on its own in 1 to 2 weeks. Social support is often all that is needed. You should be encouraged to get adequate sleep and rest. Occasionally, you may be given medicines to help you sleep.  Postpartum depression requires treatment as it can last several months or longer if it is not treated. Treatment may include individual or group therapy, medicine, or both to address any social, physiological, and psychological factors that may play a role in the depression. Regular exercise, a healthy diet, rest, and social support may also be strongly recommended.  Postpartum psychosis is more serious and needs treatment right away. Hospitalization is often needed. HOME CARE INSTRUCTIONS  Get as much rest as you can. Nap when the baby sleeps.  Exercise regularly. Some women find yoga and walking to be beneficial.  Eat a balanced and nourishing diet.  Do little things that you enjoy. Have a cup of tea, take a bubble bath, read your favorite magazine, or listen to your favorite music.  Avoid alcohol.  Ask for help with household  chores, cooking, grocery shopping, or running errands as needed. Do not try to do everything.  Talk to people close to you about how you are feeling. Get support from your partner, family members, friends, or other new moms.  Try to stay positive in how you think. Think about the things you are grateful for.  Do not spend a lot of time alone.  Only take medicine as directed by your caregiver.  Keep all your postpartum appointments.  Let your caregiver know if you have any concerns. SEEK MEDICAL CARE IF: You are having a reaction or problems with your medicine. SEEK IMMEDIATE MEDICAL CARE IF:  You have suicidal feelings.  You feel you may harm the baby or someone else. Document Released: 02/02/2004 Document Revised: 07/23/2011 Document Reviewed: 03/06/2011 [facility] Patient Information 2014 [place], [place].     Breastfeeding Deciding to breastfeed is one of the best choices you can make for you and your baby. A change in hormones during pregnancy causes your breast tissue to grow and increases the number and size of your milk ducts. These hormones also allow proteins, sugars, and fats from your blood supply to make breast milk in your milk-producing glands. Hormones prevent breast milk from being released before your baby is born as well as prompt milk flow after birth. Once breastfeeding has begun, thoughts of your baby, as well as his or her sucking or crying, can stimulate the release of milk from your milk-producing glands.  BENEFITS OF BREASTFEEDING For Your Baby  Your first milk (colostrum) helps your baby's digestive system function better.   There are antibodies in your milk that help your baby fight off infections.   Your baby has a lower incidence of asthma, allergies, and sudden infant death syndrome.   The nutrients in breast milk are better for your baby than infant formulas and are designed uniquely for your baby's needs.   Breast milk improves your baby's brain  development.   Your baby is less likely to develop other conditions, such as childhood obesity, asthma, or type 2 diabetes mellitus.  For You   Breastfeeding helps to create a very special bond between you and your baby.   Breastfeeding is convenient. Breast milk is always available at the correct temperature and costs nothing.   Breastfeeding helps to burn calories and helps you lose the weight gained during  pregnancy.   Breastfeeding makes your uterus contract to its prepregnancy size faster and slows bleeding (lochia) after you give birth.   Breastfeeding helps to lower your risk of developing type 2 diabetes mellitus, osteoporosis, and breast or ovarian cancer later in life. SIGNS THAT YOUR BABY IS HUNGRY Early Signs of Hunger  Increased alertness or activity.  Stretching.  Movement of the head from side to side.  Movement of the head and opening of the mouth when the corner of the mouth or cheek is stroked (rooting).  Increased sucking sounds, smacking lips, cooing, sighing, or squeaking.  Hand-to-mouth movements.  Increased sucking of fingers or hands. Late Signs of Hunger  Fussing.  Intermittent crying. Extreme Signs of Hunger Signs of extreme hunger will require calming and consoling before your baby will be able to breastfeed successfully. Do not wait for the following signs of extreme hunger to occur before you initiate breastfeeding:   Restlessness.  A loud, strong cry.   Screaming.   BREASTFEEDING BASICS Breastfeeding Initiation  Find a comfortable place to sit or lie down, with your neck and back well supported.  Place a pillow or rolled up blanket under your baby to bring him or her to the level of your breast (if you are seated). Nursing pillows are specially designed to help support your arms and your baby while you breastfeed.  Make sure that your baby's abdomen is facing your abdomen.   Gently massage your breast. With your fingertips,  massage from your chest wall toward your nipple in a circular motion. This encourages milk flow. You may need to continue this action during the feeding if your milk flows slowly.  Support your breast with 4 fingers underneath and your thumb above your nipple. Make sure your fingers are well away from your nipple and your baby's mouth.   Stroke your baby's lips gently with your finger or nipple.   When your baby's mouth is open wide enough, quickly bring your baby to your breast, placing your entire nipple and as much of the colored area around your nipple (areola) as possible into your baby's mouth.   More areola should be visible above your baby's upper lip than below the lower lip.   Your baby's tongue should be between his or her lower gum and your breast.   Ensure that your baby's mouth is correctly positioned around your nipple (latched). Your baby's lips should create a seal on your breast and be turned out (everted).  It is common for your baby to suck about 2-3 minutes in order to start the flow of breast milk. Latching Teaching your baby how to latch on to your breast properly is very important. An improper latch can cause nipple pain and decreased milk supply for you and poor weight gain in your baby. Also, if your baby is not latched onto your nipple properly, he or she may swallow some air during feeding. This can make your baby fussy. Burping your baby when you switch breasts during the feeding can help to get rid of the air. However, teaching your baby to latch on properly is still the best way to prevent fussiness from swallowing air while breastfeeding. Signs that your baby has successfully latched on to your nipple:    Silent tugging or silent sucking, without causing you pain.   Swallowing heard between every 3-4 sucks.    Muscle movement above and in front of his or her ears while sucking.  Signs that your baby has  not successfully latched on to nipple:    Sucking sounds or smacking sounds from your baby while breastfeeding.  Nipple pain. If you think your baby has not latched on correctly, slip your finger into the corner of your baby's mouth to break the suction and place it between your baby's gums. Attempt breastfeeding initiation again. Signs of Successful Breastfeeding Signs from your baby:   A gradual decrease in the number of sucks or complete cessation of sucking.   Falling asleep.   Relaxation of his or her body.   Retention of a small amount of milk in his or her mouth.   Letting go of your breast by himself or herself. Signs from you:  Breasts that have increased in firmness, weight, and size 1-3 hours after feeding.   Breasts that are softer immediately after breastfeeding.  Increased milk volume, as well as a change in milk consistency and color by the fifth day of breastfeeding.   Nipples that are not sore, cracked, or bleeding. Signs That Your Pecola Leisure is Getting Enough Milk  Wetting at least 3 diapers in a 24-hour period. The urine should be clear and pale yellow by age 26 days.  At least 3 stools in a 24-hour period by age 26 days. The stool should be soft and yellow.  At least 3 stools in a 24-hour period by age 76 days. The stool should be seedy and yellow.  No loss of weight greater than 10% of birth weight during the first 48 days of age.  Average weight gain of 4-7 ounces (113-198 g) per week after age 21 days.  Consistent daily weight gain by age 26 days, without weight loss after the age of 2 weeks. After a feeding, your baby may spit up a small amount. This is common. BREASTFEEDING FREQUENCY AND DURATION Frequent feeding will help you make more milk and can prevent sore nipples and breast engorgement. Breastfeed when you feel the need to reduce the fullness of your breasts or when your baby shows signs of hunger. This is called "breastfeeding on demand." Avoid introducing a pacifier to your baby while  you are working to establish breastfeeding (the first 4-6 weeks after your baby is born). After this time you may choose to use a pacifier. Research has shown that pacifier use during the first year of a baby's life decreases the risk of sudden infant death syndrome (SIDS). Allow your baby to feed on each breast as long as he or she wants. Breastfeed until your baby is finished feeding. When your baby unlatches or falls asleep while feeding from the first breast, offer the second breast. Because newborns are often sleepy in the first few weeks of life, you may need to awaken your baby to get him or her to feed. Breastfeeding times will vary from baby to baby. However, the following rules can serve as a guide to help you ensure that your baby is properly fed:  Newborns (babies 62 weeks of age or younger) may breastfeed every 1-3 hours.  Newborns should not go longer than 3 hours during the day or 5 hours during the night without breastfeeding.  You should breastfeed your baby a minimum of 8 times in a 24-hour period until you begin to introduce solid foods to your baby at around 51 months of age. BREAST MILK PUMPING Pumping and storing breast milk allows you to ensure that your baby is exclusively fed your breast milk, even at times when you are unable to breastfeed. This  is especially important if you are going back to work while you are still breastfeeding or when you are not able to be present during feedings. Your lactation consultant can give you guidelines on how long it is safe to store breast milk.  A breast pump is a machine that allows you to pump milk from your breast into a sterile bottle. The pumped breast milk can then be stored in a refrigerator or freezer. Some breast pumps are operated by hand, while others use electricity. Ask your lactation consultant which type will work best for you. Breast pumps can be purchased, but some hospitals and breastfeeding support groups lease breast pumps on  a monthly basis. A lactation consultant can teach you how to hand express breast milk, if you prefer not to use a pump.  CARING FOR YOUR BREASTS WHILE YOU BREASTFEED Nipples can become dry, cracked, and sore while breastfeeding. The following recommendations can help keep your breasts moisturized and healthy:  Avoid using soap on your nipples.   Wear a supportive bra. Although not required, special nursing bras and tank tops are designed to allow access to your breasts for breastfeeding without taking off your entire bra or top. Avoid wearing underwire-style bras or extremely tight bras.  Air dry your nipples for 3-31minutes after each feeding.   Use only cotton bra pads to absorb leaked breast milk. Leaking of breast milk between feedings is normal.   Use lanolin on your nipples after breastfeeding. Lanolin helps to maintain your skin's normal moisture barrier. If you use pure lanolin, you do not need to wash it off before feeding your baby again. Pure lanolin is not toxic to your baby. You may also hand express a few drops of breast milk and gently massage that milk into your nipples and allow the milk to air dry. In the first few weeks after giving birth, some women experience extremely full breasts (engorgement). Engorgement can make your breasts feel heavy, warm, and tender to the touch. Engorgement peaks within 3-5 days after you give birth. The following recommendations can help ease engorgement:  Completely empty your breasts while breastfeeding or pumping. You may want to start by applying warm, moist heat (in the shower or with warm water-soaked hand towels) just before feeding or pumping. This increases circulation and helps the milk flow. If your baby does not completely empty your breasts while breastfeeding, pump any extra milk after he or she is finished.  Wear a snug bra (nursing or regular) or tank top for 1-2 days to signal your body to slightly decrease milk  production.  Apply ice packs to your breasts, unless this is too uncomfortable for you.  Make sure that your baby is latched on and positioned properly while breastfeeding. If engorgement persists after 48 hours of following these recommendations, contact your health care provider or a Advertising copywriter. OVERALL HEALTH CARE RECOMMENDATIONS WHILE BREASTFEEDING  Eat healthy foods. Alternate between meals and snacks, eating 3 of each per day. Because what you eat affects your breast milk, some of the foods may make your baby more irritable than usual. Avoid eating these foods if you are sure that they are negatively affecting your baby.  Drink milk, fruit juice, and water to satisfy your thirst (about 10 glasses a day).   Rest often, relax, and continue to take your prenatal vitamins to prevent fatigue, stress, and anemia.  Continue breast self-awareness checks.  Avoid chewing and smoking tobacco.  Avoid alcohol and drug use. Some medicines  that may be harmful to your baby can pass through breast milk. It is important to ask your health care provider before taking any medicine, including all over-the-counter and prescription medicine as well as vitamin and herbal supplements. It is possible to become pregnant while breastfeeding. If birth control is desired, ask your health care provider about options that will be safe for your baby. SEEK MEDICAL CARE IF:   You feel like you want to stop breastfeeding or have become frustrated with breastfeeding.  You have painful breasts or nipples.  Your nipples are cracked or bleeding.  Your breasts are red, tender, or warm.  You have a swollen area on either breast.  You have a fever or chills.  You have nausea or vomiting.  You have drainage other than breast milk from your nipples.  Your breasts do not become full before feedings by the fifth day after you give birth.  You feel sad and depressed.  Your baby is too sleepy to eat  well.  Your baby is having trouble sleeping.   Your baby is wetting less than 3 diapers in a 24-hour period.  Your baby has less than 3 stools in a 24-hour period.  Your baby's skin or the white part of his or her eyes becomes yellow.   Your baby is not gaining weight by 38 days of age. SEEK IMMEDIATE MEDICAL CARE IF:   Your baby is overly tired (lethargic) and does not want to wake up and feed.  Your baby develops an unexplained fever. Document Released: 04/30/2005 Document Revised: 05/05/2013 Document Reviewed: 10/22/2012 [facility] Patient Information 2015 [place], [place]. This information is not intended to replace advice given to you by your health care provider. Make sure you discuss any questions you have with your health care provider.

## 2014-09-05 NOTE — Progress Notes (Signed)
Filed Vitals:   09/05/14 0800 09/05/14 0846 09/05/14 0849 09/05/14 1020  BP: 128/71   142/82  Pulse: 68   68  Temp:   98.1 F (36.7 C) 98.2 F (36.8 C)  TempSrc:   Oral Oral  Resp: 16  18 18   Height:      Weight:      SpO2: 97% 100%  99%   Orthostatic stable  OK to transfer to postpartum unit

## 2014-09-06 MED ORDER — IBUPROFEN 600 MG PO TABS: 600.0000 mg | ORAL_TABLET | Freq: Four times a day (QID) | ORAL | Status: DC

## 2014-09-06 MED ORDER — FERROUS SULFATE 325 (65 FE) MG PO TABS: 325.0000 mg | ORAL_TABLET | Freq: Two times a day (BID) | ORAL | Status: AC

## 2014-09-06 MED ORDER — NORETHINDRONE 0.35 MG PO TABS: 1.0000 | ORAL_TABLET | Freq: Every day | ORAL | Status: AC

## 2014-09-06 MED ORDER — PRENATAL MULTIVITAMIN CH: 1.0000 | ORAL_TABLET | Freq: Every day | ORAL | Status: AC

## 2014-09-06 MED ORDER — LABETALOL HCL 100 MG PO TABS: 100.0000 mg | ORAL_TABLET | Freq: Two times a day (BID) | ORAL | Status: DC

## 2014-09-06 NOTE — Discharge Instructions (Signed)
Postpartum Care After Vaginal Delivery  After you deliver your newborn (postpartum period), the usual stay in the hospital is 24-72 hours. If there were problems with your labor or delivery, or if you have other medical problems, you might be in the hospital longer.   While you are in the hospital, you will receive help and instructions on how to care for yourself and your newborn during the postpartum period.   While you are in the hospital:  · Be sure to tell your nurses if you have pain or discomfort, as well as where you feel the pain and what makes the pain worse.  · If you had an incision made near your vagina (episiotomy) or if you had some tearing during delivery, the nurses may put ice packs on your episiotomy or tear. The ice packs may help to reduce the pain and swelling.  · If you are breastfeeding, you may feel uncomfortable contractions of your uterus for a couple of weeks. This is normal. The contractions help your uterus get back to normal size.  · It is normal to have some bleeding after delivery.  ¨ For the first 1-3 days after delivery, the flow is red and the amount may be similar to a period.  ¨ It is common for the flow to start and stop.  ¨ In the first few days, you may pass some small clots. Let your nurses know if you begin to pass large clots or your flow increases.  ¨ Do not  flush blood clots down the toilet before having the nurse look at them.  ¨ During the next 3-10 days after delivery, your flow should become more watery and pink or brown-tinged in color.  ¨ Ten to fourteen days after delivery, your flow should be a small amount of yellowish-white discharge.  ¨ The amount of your flow will decrease over the first few weeks after delivery. Your flow may stop in 6-8 weeks. Most women have had their flow stop by 12 weeks after delivery.  · You should change your sanitary pads frequently.  · Wash your hands thoroughly with soap and water for at least 20 seconds after changing pads, using  the toilet, or before holding or feeding your newborn.  · You should feel like you need to empty your bladder within the first 6-8 hours after delivery.  · In case you become weak, lightheaded, or faint, call your nurse before you get out of bed for the first time and before you take a shower for the first time.  · Within the first few days after delivery, your breasts may begin to feel tender and full. This is called engorgement. Breast tenderness usually goes away within 48-72 hours after engorgement occurs. You may also notice milk leaking from your breasts. If you are not breastfeeding, do not stimulate your breasts. Breast stimulation can make your breasts produce more milk.  · Spending as much time as possible with your newborn is very important. During this time, you and your newborn can feel close and get to know each other. Having your newborn stay in your room (rooming in) will help to strengthen the bond with your newborn.  It will give you time to get to know your newborn and become comfortable caring for your newborn.  · Your hormones change after delivery. Sometimes the hormone changes can temporarily cause you to feel sad or tearful. These feelings should not last more than a few days. If these feelings last longer than   that, you should talk to your caregiver.  · If desired, talk to your caregiver about methods of family planning or contraception.  · Talk to your caregiver about immunizations. Your caregiver may want you to have the following immunizations before leaving the hospital:  ¨ Tetanus, diphtheria, and pertussis (Tdap) or tetanus and diphtheria (Td) immunization. It is very important that you and your family (including grandparents) or others caring for your newborn are up-to-date with the Tdap or Td immunizations. The Tdap or Td immunization can help protect your newborn from getting ill.  ¨ Rubella immunization.  ¨ Varicella (chickenpox) immunization.  ¨ Influenza immunization. You should  receive this annual immunization if you did not receive the immunization during your pregnancy.  Document Released: 02/25/2007 Document Revised: 01/23/2012 Document Reviewed: 12/26/2011  [employer]® Patient Information ©2015 [employer]. This information is not intended to replace advice given to you by your health care provider. Make sure you discuss any questions you have with your health care provider.  Postpartum Depression and Baby Blues  The postpartum period begins right after the birth of a baby. During this time, there is often a great amount of joy and excitement. It is also a time of many changes in the life of the parents. Regardless of how many times a mother gives birth, each child brings new challenges and dynamics to the family. It is not unusual to have feelings of excitement along with confusing shifts in moods, emotions, and thoughts. All mothers are at risk of developing postpartum depression or the "baby blues." These mood changes can occur right after giving birth, or they may occur many months after giving birth. The baby blues or postpartum depression can be mild or severe. Additionally, postpartum depression can go away rather quickly, or it can be a long-term condition.   CAUSES  Raised hormone levels and the rapid drop in those levels are thought to be a main cause of postpartum depression and the baby blues. A number of hormones change during and after pregnancy. Estrogen and progesterone usually decrease right after the delivery of your baby. The levels of thyroid hormone and various cortisol steroids also rapidly drop. Other factors that play a role in these mood changes include major life events and genetics.   RISK FACTORS  If you have any of the following risks for the baby blues or postpartum depression, know what symptoms to watch out for during the postpartum period. Risk factors that may increase the likelihood of getting the baby blues or postpartum depression include:  · Having  a personal or family history of depression.    · Having depression while being pregnant.    · Having premenstrual mood issues or mood issues related to oral contraceptives.  · Having a lot of life stress.    · Having marital conflict.    · Lacking a social support network.    · Having a baby with special needs.    · Having health problems, such as diabetes.    SIGNS AND SYMPTOMS  Symptoms of baby blues include:  · Brief changes in mood, such as going from extreme happiness to sadness.  · Decreased concentration.    · Difficulty sleeping.    · Crying spells, tearfulness.    · Irritability.    · Anxiety.    Symptoms of postpartum depression typically begin within the first month after giving birth. These symptoms include:  · Difficulty sleeping or excessive sleepiness.    · Marked weight loss.    · Agitation.    · Feelings of worthlessness.    · Lack   of interest in activity or food.    Postpartum psychosis is a very serious condition and can be dangerous. Fortunately, it is rare. Displaying any of the following symptoms is cause for immediate medical attention. Symptoms of postpartum psychosis include:   · Hallucinations and delusions.    · Bizarre or disorganized behavior.    · Confusion or disorientation.    DIAGNOSIS   A diagnosis is made by an evaluation of your symptoms. There are no medical or lab tests that lead to a diagnosis, but there are various questionnaires that a health care provider may use to identify those with the baby blues, postpartum depression, or psychosis. Often, a screening tool called the Edinburgh Postnatal Depression Scale is used to diagnose depression in the postpartum period.   TREATMENT  The baby blues usually goes away on its own in 1-2 weeks. Social support is often all that is needed. You will be encouraged to get adequate sleep and rest. Occasionally, you may be given medicines to help you sleep.   Postpartum depression requires treatment because it can last several months or  longer if it is not treated. Treatment may include individual or group therapy, medicine, or both to address any social, physiological, and psychological factors that may play a role in the depression. Regular exercise, a healthy diet, rest, and social support may also be strongly recommended.   Postpartum psychosis is more serious and needs treatment right away. Hospitalization is often needed.  HOME CARE INSTRUCTIONS  · Get as much rest as you can. Nap when the baby sleeps.    · Exercise regularly. Some women find yoga and walking to be beneficial.    · Eat a balanced and nourishing diet.    · Do little things that you enjoy. Have a cup of tea, take a bubble bath, read your favorite magazine, or listen to your favorite music.  · Avoid alcohol.    · Ask for help with household chores, cooking, grocery shopping, or running errands as needed. Do not try to do everything.    · Talk to people close to you about how you are feeling. Get support from your partner, family members, friends, or other new moms.  · Try to stay positive in how you think. Think about the things you are grateful for.    · Do not spend a lot of time alone.    · Only take over-the-counter or prescription medicine as directed by your health care provider.  · Keep all your postpartum appointments.    · Let your health care provider know if you have any concerns.    SEEK MEDICAL CARE IF:  You are having a reaction to or problems with your medicine.  SEEK IMMEDIATE MEDICAL CARE IF:  · You have suicidal feelings.    · You think you may harm the baby or someone else.  MAKE SURE YOU:  · Understand these instructions.  · Will watch your condition.  · Will get help right away if you are not doing well or get worse.  Document Released: 02/02/2004 Document Revised: 05/05/2013 Document Reviewed: 02/09/2013  [employer]® Patient Information ©2015 [employer]. This information is not intended to replace advice given to you by your health care provider. Make sure  you discuss any questions you have with your health care provider.

## 2014-09-06 NOTE — Discharge Summary (Signed)
Vaginal Delivery Discharge Summary  Whitney Barajas  DOB:    [birth year] MRN:    [id number] CSN:    [id number]  Date of admission:                  09/02/2014  Date of discharge:                   09/06/2014  Procedures this admission:  09/03/2014                           external version by Dr. Marline Backbone  Date of Delivery: 09/04/2014 NSVD by Caryl Never, CNM  Newborn Data:  Live born female  Birth Weight: 7 lb 1.9 oz (3229 g) APGAR: 8, 9  Home with mother. Name: Whitney Barajas Circumcision Plan: In hospital  History of Present Illness:  Ms. Whitney Barajas is a 34 y.o. female, [id number], who presents at [redacted]w[redacted]d weeks gestation. The patient has been followed at the [facility] and Gynecology division of [facility]. She was admitted Management of elevated blood pressure, headaches, and oblique presentation. Her pregnancy has been complicated by: Nothing until the time of admission.  Hospital course:  The patient was admitted for management of headaches and elevated blood pressure. The patient was seen in the office with the previously mentioned complaints. She was sent to the hospital to rule out preeclampsia. An ultrasound show that her infant was in oblique presentation. PIH labs were obtained and the patient was noted to be preeclamptic. Her headaches were treated. Her headaches continued in spite of medication. She was diagnosed as preeclampsia with severe features. The decision was made to proceed with delivery. Because the patient was oblique, and external version was attempted. The version was successful. The patient then had her membranes ruptured and augmentation of labor was begun. She labored slowly. She did have an epidural for her external version and for pain management during labor. She was given magnesium. She proceeded to have a vaginal delivery of a healthy infant. Her delivery was not complicated. Her postpartum course was complicated by  preeclampsia for which she was given 24 hours of magnesium. She diuresed nicely. Her blood pressures were controlled postpartum. No medication was required. She was discharged to home on postpartum day 2 doing well.   Feeding:  breast  Contraception:  oral progesterone-only contraceptive  Discharge hemoglobin:  HEMOGLOBIN  Date Value Ref Range Status  09/05/2014 7.5* 12.0 - 15.0 g/dL Final   HCT  Date Value Ref Range Status  09/05/2014 24.7* 36.0 - 46.0 % Final    Discharge Physical Exam:   General: alert and no distress Lochia: appropriate Uterine Fundus: firm Incision: Not applicable DVT Evaluation: No evidence of DVT seen on physical exam.  Intrapartum Procedures: spontaneous vaginal delivery Postpartum Procedures: none Complications-Operative and Postpartum: none  Discharge Diagnoses: Preterm condition, preeclampsia with severe features, anemia, obesity  Discharge Information:  Activity:           pelvic rest Diet:                routine Medications: PNV, Colace and Iron Condition:      stable and improved Instructions:   Postpartum Care After Vaginal Delivery  After you deliver your newborn (postpartum period), the usual stay in the hospital is 24 72 hours. If there were problems with your labor or delivery, or if you have other medical problems, you might be in the  hospital longer.  While you are in the hospital, you will receive help and instructions on how to care for yourself and your newborn during the postpartum period.  While you are in the hospital:  Be sure to tell your nurses if you have pain or discomfort, as well as where you feel the pain and what makes the pain worse.  If you had an incision made near your vagina (episiotomy) or if you had some tearing during delivery, the nurses may put ice packs on your episiotomy or tear. The ice packs may help to reduce the pain and swelling.  If you are breastfeeding, you may feel uncomfortable contractions  of your uterus for a couple of weeks. This is normal. The contractions help your uterus get back to normal size.  It is normal to have some bleeding after delivery.  For the first 1 3 days after delivery, the flow is red and the amount may be similar to a period.  It is common for the flow to start and stop.  In the first few days, you may pass some small clots. Let your nurses know if you begin to pass large clots or your flow increases.  Do not  flush blood clots down the toilet before having the nurse look at them.  During the next 3 10 days after delivery, your flow should become more watery and pink or brown-tinged in color.  Ten to fourteen days after delivery, your flow should be a small amount of yellowish-white discharge.  The amount of your flow will decrease over the first few weeks after delivery. Your flow may stop in 6 8 weeks. Most women have had their flow stop by 12 weeks after delivery.  You should change your sanitary pads frequently.  Wash your hands thoroughly with soap and water for at least 20 seconds after changing pads, using the toilet, or before holding or feeding your newborn.  You should feel like you need to empty your bladder within the first 6 8 hours after delivery.  In case you become weak, lightheaded, or faint, call your nurse before you get out of bed for the first time and before you take a shower for the first time.  Within the first few days after delivery, your breasts may begin to feel tender and full. This is called engorgement. Breast tenderness usually goes away within 48 72 hours after engorgement occurs. You may also notice milk leaking from your breasts. If you are not breastfeeding, do not stimulate your breasts. Breast stimulation can make your breasts produce more milk.  Spending as much time as possible with your newborn is very important. During this time, you and your newborn can feel close and get to know each other. Having your  newborn stay in your room (rooming in) will help to strengthen the bond with your newborn. It will give you time to get to know your newborn and become comfortable caring for your newborn.  Your hormones change after delivery. Sometimes the hormone changes can temporarily cause you to feel sad or tearful. These feelings should not last more than a few days. If these feelings last longer than that, you should talk to your caregiver.  If desired, talk to your caregiver about methods of family planning or contraception.  Talk to your caregiver about immunizations. Your caregiver may want you to have the following immunizations before leaving the hospital:  Tetanus, diphtheria, and pertussis (Tdap) or tetanus and diphtheria (Td) immunization. It is very  important that you and your family (including grandparents) or others caring for your newborn are up-to-date with the Tdap or Td immunizations. The Tdap or Td immunization can help protect your newborn from getting ill.  Rubella immunization.  Varicella (chickenpox) immunization.  Influenza immunization. You should receive this annual immunization if you did not receive the immunization during your pregnancy. Document Released: 02/25/2007 Document Revised: 01/23/2012 Document Reviewed: 12/26/2011 [facility] Patient Information 2014 [place], [place].   Postpartum Depression and Baby Blues  The postpartum period begins right after the birth of a baby. During this time, there is often a great amount of joy and excitement. It is also a time of considerable changes in the life of the parent(s). Regardless of how many times a mother gives birth, each child brings new challenges and dynamics to the family. It is not unusual to have feelings of excitement accompanied by confusing shifts in moods, emotions, and thoughts. All mothers are at risk of developing postpartum depression or the "baby blues." These mood changes can occur right after giving birth, or  they may occur many months after giving birth. The baby blues or postpartum depression can be mild or severe. Additionally, postpartum depression can resolve rather quickly, or it can be a long-term condition. CAUSES Elevated hormones and their rapid decline are thought to be a main cause of postpartum depression and the baby blues. There are a number of hormones that radically change during and after pregnancy. Estrogen and progesterone usually decrease immediately after delivering your baby. The level of thyroid hormone and various cortisol steroids also rapidly drop. Other factors that play a major role in these changes include major life events and genetics.  RISK FACTORS If you have any of the following risks for the baby blues or postpartum depression, know what symptoms to watch out for during the postpartum period. Risk factors that may increase the likelihood of getting the baby blues or postpartum depression include: 1. Havinga personal or family history of depression. 2. Having depression while being pregnant. 3. Having premenstrual or oral contraceptive-associated mood issues. 4. Having exceptional life stress. 5. Having marital conflict. 6. Lacking a social support network. 7. Having a baby with special needs. 8. Having health problems such as diabetes. SYMPTOMS Baby blues symptoms include:  Brief fluctuations in mood, such as going from extreme happiness to sadness.  Decreased concentration.  Difficulty sleeping.  Crying spells, tearfulness.  Irritability.  Anxiety. Postpartum depression symptoms typically begin within the first month after giving birth. These symptoms include:  Difficulty sleeping or excessive sleepiness.  Marked weight loss.  Agitation.  Feelings of worthlessness.  Lack of interest in activity or food. Postpartum psychosis is a very concerning condition and can be dangerous. Fortunately, it is rare. Displaying any of the following symptoms is  cause for immediate medical attention. Postpartum psychosis symptoms include:  Hallucinations and delusions.  Bizarre or disorganized behavior.  Confusion or disorientation. DIAGNOSIS  A diagnosis is made by an evaluation of your symptoms. There are no medical or lab tests that lead to a diagnosis, but there are various questionnaires that a caregiver may use to identify those with the baby blues, postpartum depression, or psychosis. Often times, a screening tool called the [place] Postnatal Depression Scale is used to diagnose depression in the postpartum period.  TREATMENT The baby blues usually goes away on its own in 1 to 2 weeks. Social support is often all that is needed. You should be encouraged to get adequate sleep and rest. Occasionally,  you may be given medicines to help you sleep.  Postpartum depression requires treatment as it can last several months or longer if it is not treated. Treatment may include individual or group therapy, medicine, or both to address any social, physiological, and psychological factors that may play a role in the depression. Regular exercise, a healthy diet, rest, and social support may also be strongly recommended.  Postpartum psychosis is more serious and needs treatment right away. Hospitalization is often needed. HOME CARE INSTRUCTIONS  Get as much rest as you can. Nap when the baby sleeps.  Exercise regularly. Some women find yoga and walking to be beneficial.  Eat a balanced and nourishing diet.  Do little things that you enjoy. Have a cup of tea, take a bubble bath, read your favorite magazine, or listen to your favorite music.  Avoid alcohol.  Ask for help with household chores, cooking, grocery shopping, or running errands as needed. Do not try to do everything.  Talk to people close to you about how you are feeling. Get support from your partner, family members, friends, or other new moms.  Try to stay positive in how you think. Think  about the things you are grateful for.  Do not spend a lot of time alone.  Only take medicine as directed by your caregiver.  Keep all your postpartum appointments.  Let your caregiver know if you have any concerns. SEEK MEDICAL CARE IF: You are having a reaction or problems with your medicine. SEEK IMMEDIATE MEDICAL CARE IF:  You have suicidal feelings.  You feel you may harm the baby or someone else. Document Released: 02/02/2004 Document Revised: 07/23/2011 Document Reviewed: 03/06/2011 [facility] Patient Information 2014 [place], [place].   Discharge to: home  Follow-up Information    Follow up with [facility] In 6 weeks.   Specialty:  Obstetrics and Gynecology   Why:  Postpartum check up   Contact information:   [address] [place] [place] [address] [phone]       Janine Limbo 09/06/2014

## 2014-09-06 NOTE — Lactation Note (Signed)
This note was copied from the chart of Whitney Barajas. Lactation Consultation Note Looking at chart baby has had 11 voids and 7 stools. Baby is cluster feeding. Only had 3 % weight loss. LPI baby having good latch scores. Mom currently sleeping. Patient Name: Whitney Barajas     Maternal Data    Feeding Feeding Type: Breast Fed Length of feed: 20 min  LATCH Score/Interventions                      Lactation Tools Discussed/Used     Consult Status      Currie Dennin, Diamond NickelLAURA G Barajas, 5:47 AM

## 2014-09-06 NOTE — Progress Notes (Signed)
CLINICAL SOCIAL WORK MATERNAL/CHILD NOTE  Patient Details  Name: Whitney Barajas MRN: [id number] Date of Birth: 09/04/2012  Date:  09/06/2014  Clinical Social Worker Initiating Note:  Leeland Lovelady, LCSW Date/ Time Initiated:  09/06/14/0915     Child's Name:  Whitney Barajas  Legal Guardian:  Lemmie and Terry Fanara (parents)  Need for Interpreter:  None   Date of Referral:  09/04/14     Reason for Referral:  History of depression and postpartum depression  Referral Source:  Central Nursery  Address:  [address] [place], [place] [address] Phone number:  [phone]  Household Members:  MOB, FOB, and 4 other children  Natural Supports (not living in the home):  Immediate Family   Professional Supports: None ientified   Employment: Full-time- MOB reported feeling supported by her employer and co-workers  Financial Resources:  Medicaid   Other Resources:  None reported  Cultural/Religious Considerations Which May Impact Care:  None reported  Strengths:  Ability to meet basic needs , Home prepared for child , Pediatrician chosen    Risk Factors/Current Problems:   1) Mental Health Concerns: MOB reported diagnosis of depression at age 34.  She reported history of postpartum depression.     Cognitive State:  Alert , Insightful , Able to Concentrate    Mood/Affect:  MOB presented in a pleasant mood and displayed a full range/bright affect.  She presented as easily engaged, receptive to the visit, and excited about discharge home.  No symptoms of depression noted.   CSW Assessment:  CSW received consult for history of depression and postpartum depression.  CSW unable to initiate assessment earlier in admission due to MOB's AICU admission after infant born.  MOB presented with insight and awareness related to her mental health needs.  She endorsed diagnosis of depression since age 18, and shared belief that due to length of time that she has experienced symptoms, she does not  identify her symptoms as a current problem. She discussed that she had worsening of symptoms about 32 year ago, but did not clarify stressors/events that may have led to symptom change.  She reported that she was emotional, highly tearful, and often feel unappreciated/unheard.  She endorsed difficulties getting out of bed, but denied SI. Per MOB, symptoms resolved/were reduced when she returned to work since she had less time to ruminate on her negative thoughts.  MOB denied mental health symptoms during this pregnancy and now as she transitions to the postpartum period. She stated that she and the FOB are able to openly discuss her mental health, and the FOB shared that he feels comfortable talking to the MOB if he begins to feel concerned about her symptoms.     MOB presents with self-awareness about her mental health needs as she was able to articulate warning signs/specific behaviors that would demonstrate worsening of symptoms. She stated that she and the FOB have no specific plans to have a formal intervention to address mental health symptoms at this time since she has been stable. MOB recognizes her increased risk for developing symptoms given her history, but she stated that she will consult with her PCP since her PCP previously prescribed her Zoloft.  She presents with an awareness of need to intervene early if she notes symptoms since it would take time for her to receive therapeutic benefit from medications if medications needed to be re-started.  MOB shared belief that she will be "okay" since she does not have opportunities to dwell on symptoms since she   needs to be/wants to be present for her children.    Overall, MOB reported feeling excited and looking forward to the infant's arrival. She stated that her other children have been present at the hospital and appear to be coping well with the transition. She endorsed having positive support and having the home prepared for the infant.  MOB denied  additional questions, concerns, or needs.  She expressed appreciation for the visit and agreed to contact CSW if needs arise.   CSW Plan/Description:   1)Patient/Family Education: CSW reviewed signs and symptoms of postpartum depression.  MOB was previously prescribed Zoloft by her PCP, and reported intention to follow up with her PCP if she notes signs and symptoms of depression/postpartum depression. 2) No Further Intervention Required/No Barriers to Discharge   Arias Weinert N, LCSW 09/06/2014, 11:10 AM  

## 2014-09-06 NOTE — Lactation Note (Signed)
This note was copied from the chart of Whitney Barajas. Lactation Consultation Note  Patient Name: Whitney Barajas ZOXWR'UToday's Date: 09/06/2014 Reason for consult: Follow-up assessment;Other (Comment) (updated doc flow sheets )  Baby is 53 hours old , at 3% weight loss , LS =10 , recently breast fed per mom for 30 mins. Voids and stools adequate for age. Mom is a post MagSO4 mom and still has generalized swelling, Denies sore nipples , and breast are feeling alittle fuller. Per mom will be D/C on B/P meds. LC discussed being a post MagSO4 and with all her swelling ,milk coming in can be delayed. Encouraged mom to drink plenty of water, and find balance with rest. Per mom has a DEBP at home And LC suggested if breast aren't fuller in by 4 days, and swelling slow to decrease to add post pumping 4 times a day For 10 mins to make up for excessive edema and elevated B/P. Mother informed of post-discharge support and given phone number to the lactation department, including services for phone  call assistance; out-patient appointments; and breastfeeding support group. List of other breastfeeding resources in the community  given in the handout. Encouraged mother to call for problems or concerns related to breastfeeding.   Maternal Data    Feeding Feeding Type:  (per mom recently breast fed ) Length of feed: 30 min (per mom )  LATCH Score/Interventions                      Lactation Tools Discussed/Used     Consult Status Consult Status: Complete Date: 09/06/14    Kathrin Greathouseorio, Ehren Berisha Ann 09/06/2014, 11:08 AM
